# Patient Record
Sex: Male | Born: 1965
Health system: Southern US, Community
[De-identification: ages and names within clinical notes are randomized; demographics above are authoritative.]

## PROBLEM LIST (undated history)

## (undated) DIAGNOSIS — F419 Anxiety disorder, unspecified: Secondary | ICD-10-CM

## (undated) HISTORY — PX: NO PAST SURGERIES: SHX2092

## (undated) HISTORY — DX: Anxiety disorder, unspecified: F41.9

---

## 2001-10-14 ENCOUNTER — Encounter: Payer: Self-pay | Admitting: Specialist

## 2001-10-14 ENCOUNTER — Encounter: Admission: RE | Admit: 2001-10-14 | Discharge: 2001-10-14 | Payer: Self-pay | Admitting: Specialist

## 2001-11-28 ENCOUNTER — Encounter: Payer: Self-pay | Admitting: Internal Medicine

## 2002-01-14 ENCOUNTER — Encounter: Payer: Self-pay | Admitting: Internal Medicine

## 2002-02-25 ENCOUNTER — Encounter: Payer: Self-pay | Admitting: Internal Medicine

## 2002-03-20 ENCOUNTER — Encounter: Payer: Self-pay | Admitting: Internal Medicine

## 2002-03-31 ENCOUNTER — Encounter: Payer: Self-pay | Admitting: Internal Medicine

## 2003-01-18 ENCOUNTER — Emergency Department (HOSPITAL_COMMUNITY): Admission: EM | Admit: 2003-01-18 | Discharge: 2003-01-18 | Payer: Self-pay | Admitting: Emergency Medicine

## 2007-05-22 ENCOUNTER — Ambulatory Visit: Payer: Self-pay | Admitting: Internal Medicine

## 2007-05-22 DIAGNOSIS — E119 Type 2 diabetes mellitus without complications: Secondary | ICD-10-CM | POA: Insufficient documentation

## 2007-05-22 LAB — CONVERTED CEMR LAB
Bilirubin Urine: NEGATIVE
Blood in Urine, dipstick: NEGATIVE
Glucose, Bld: 258 mg/dL
Glucose, Urine, Semiquant: >=1000
Nitrite: NEGATIVE
Protein, U semiquant: NEGATIVE
Specific Gravity, Urine: 1.02
Urobilinogen, UA: NEGATIVE
WBC Urine, dipstick: NEGATIVE
pH: 5

## 2007-05-28 LAB — CONVERTED CEMR LAB
BUN: 16 mg/dL (ref 6–23)
Basophils Absolute: 0 10*3/uL (ref 0.0–0.1)
Basophils Relative: 1 % (ref 0–1)
CO2: 25 meq/L (ref 19–32)
Calcium: 9.6 mg/dL (ref 8.4–10.5)
Chloride: 99 meq/L (ref 96–112)
Creatinine, Ser: 0.87 mg/dL (ref 0.40–1.50)
Eosinophils Absolute: 0 10*3/uL — ABNORMAL LOW (ref 0.2–0.7)
Eosinophils Relative: 0 % (ref 0–5)
Glucose, Bld: 243 mg/dL — ABNORMAL HIGH (ref 70–99)
HCT: 44.5 % (ref 39.0–52.0)
Hemoglobin: 14.8 g/dL (ref 13.0–17.0)
Hgb A1c MFr Bld: 11.9 % — ABNORMAL HIGH (ref 4.6–6.1)
Lymphocytes Relative: 44 % (ref 12–46)
Lymphs Abs: 2.2 10*3/uL (ref 0.7–4.0)
MCHC: 33.3 g/dL (ref 30.0–36.0)
MCV: 82.4 fL (ref 78.0–100.0)
Monocytes Absolute: 0.5 10*3/uL (ref 0.1–1.0)
Monocytes Relative: 10 % (ref 3–12)
Neutro Abs: 2.3 10*3/uL (ref 1.7–7.7)
Neutrophils Relative %: 45 % (ref 43–77)
Platelets: 327 10*3/uL (ref 150–400)
Potassium: 5.5 meq/L — ABNORMAL HIGH (ref 3.5–5.3)
RBC: 5.4 M/uL (ref 4.22–5.81)
RDW: 12.5 % (ref 11.5–15.5)
Sodium: 134 meq/L — ABNORMAL LOW (ref 135–145)
TSH: 1.639 microintl units/mL (ref 0.350–5.50)
WBC: 5 10*3/uL (ref 4.0–10.5)

## 2007-06-12 ENCOUNTER — Ambulatory Visit: Payer: Self-pay | Admitting: Internal Medicine

## 2007-06-17 LAB — CONVERTED CEMR LAB
ALT: 27 units/L (ref 0–53)
AST: 24 units/L (ref 0–37)
Albumin: 4.1 g/dL (ref 3.5–5.2)
Alkaline Phosphatase: 75 units/L (ref 39–117)
BUN: 14 mg/dL (ref 6–23)
Bilirubin, Direct: 0.1 mg/dL (ref 0.0–0.3)
CO2: 29 meq/L (ref 19–32)
Calcium: 9.7 mg/dL (ref 8.4–10.5)
Chloride: 100 meq/L (ref 96–112)
Creatinine, Ser: 1.1 mg/dL (ref 0.4–1.5)
GFR calc Af Amer: 95 mL/min
GFR calc non Af Amer: 78 mL/min
Glucose, Bld: 182 mg/dL — ABNORMAL HIGH (ref 70–99)
Potassium: 4.9 meq/L (ref 3.5–5.1)
Sodium: 136 meq/L (ref 135–145)
Total Bilirubin: 1 mg/dL (ref 0.3–1.2)
Total Protein: 7.8 g/dL (ref 6.0–8.3)

## 2007-08-07 ENCOUNTER — Ambulatory Visit: Payer: Self-pay | Admitting: Internal Medicine

## 2007-08-12 LAB — CONVERTED CEMR LAB
ALT: 29 units/L (ref 0–53)
AST: 28 units/L (ref 0–37)
Albumin: 4.3 g/dL (ref 3.5–5.2)
Alkaline Phosphatase: 70 units/L (ref 39–117)
Bilirubin, Direct: 0.1 mg/dL (ref 0.0–0.3)
Creatinine,U: 74.9 mg/dL
Hgb A1c MFr Bld: 8.4 % — ABNORMAL HIGH (ref 4.6–6.0)
Microalb Creat Ratio: 10.7 mg/g (ref 0.0–30.0)
Microalb, Ur: 0.8 mg/dL (ref 0.0–1.9)
Total Bilirubin: 0.8 mg/dL (ref 0.3–1.2)
Total Protein: 8 g/dL (ref 6.0–8.3)

## 2007-11-26 ENCOUNTER — Encounter: Payer: Self-pay | Admitting: Internal Medicine

## 2007-11-27 ENCOUNTER — Ambulatory Visit: Payer: Self-pay | Admitting: Internal Medicine

## 2007-12-05 ENCOUNTER — Ambulatory Visit: Payer: Self-pay | Admitting: Internal Medicine

## 2007-12-10 LAB — CONVERTED CEMR LAB
ALT: 26 units/L (ref 0–53)
AST: 28 units/L (ref 0–37)
BUN: 19 mg/dL (ref 6–23)
CO2: 27 meq/L (ref 19–32)
Calcium: 9.4 mg/dL (ref 8.4–10.5)
Chloride: 104 meq/L (ref 96–112)
Cholesterol: 165 mg/dL (ref 0–200)
Creatinine, Ser: 0.9 mg/dL (ref 0.4–1.5)
Creatinine,U: 121.8 mg/dL
GFR calc Af Amer: 120 mL/min
GFR calc non Af Amer: 99 mL/min
Glucose, Bld: 110 mg/dL — ABNORMAL HIGH (ref 70–99)
HDL: 55.5 mg/dL (ref >39.0)
Hgb A1c MFr Bld: 6.7 % — ABNORMAL HIGH (ref 4.6–6.0)
LDL Cholesterol: 89 mg/dL (ref 0–99)
Microalb Creat Ratio: 12.3 mg/g (ref 0.0–30.0)
Microalb, Ur: 1.5 mg/dL (ref 0.0–1.9)
Potassium: 4.3 meq/L (ref 3.5–5.1)
Sodium: 139 meq/L (ref 135–145)
Total CHOL/HDL Ratio: 3
Triglycerides: 101 mg/dL (ref 0–149)
VLDL: 20 mg/dL (ref 0–40)

## 2008-02-07 ENCOUNTER — Encounter: Payer: Self-pay | Admitting: Internal Medicine

## 2008-03-30 ENCOUNTER — Ambulatory Visit: Payer: Self-pay | Admitting: Internal Medicine

## 2008-03-30 DIAGNOSIS — M549 Dorsalgia, unspecified: Secondary | ICD-10-CM

## 2008-03-30 DIAGNOSIS — F411 Generalized anxiety disorder: Secondary | ICD-10-CM | POA: Insufficient documentation

## 2008-04-03 ENCOUNTER — Encounter (INDEPENDENT_AMBULATORY_CARE_PROVIDER_SITE_OTHER): Payer: Self-pay | Admitting: *Deleted

## 2008-04-03 LAB — CONVERTED CEMR LAB: Hgb A1c MFr Bld: 6.3 % — ABNORMAL HIGH (ref 4.6–6.0)

## 2009-09-03 ENCOUNTER — Ambulatory Visit: Payer: Self-pay | Admitting: Internal Medicine

## 2009-09-08 ENCOUNTER — Telehealth: Payer: Self-pay | Admitting: Internal Medicine

## 2009-09-08 LAB — CONVERTED CEMR LAB
ALT: 46 units/L (ref 0–53)
AST: 35 units/L (ref 0–37)
BUN: 14 mg/dL (ref 6–23)
CO2: 29 meq/L (ref 19–32)
Calcium: 9.5 mg/dL (ref 8.4–10.5)
Chloride: 100 meq/L (ref 96–112)
Creatinine, Ser: 0.9 mg/dL (ref 0.4–1.5)
Creatinine,U: 201.7 mg/dL
GFR calc non Af Amer: 97.77 mL/min (ref >60)
Glucose, Bld: 163 mg/dL — ABNORMAL HIGH (ref 70–99)
Hgb A1c MFr Bld: 8.3 % — ABNORMAL HIGH (ref 4.6–6.5)
Microalb Creat Ratio: 36.2 mg/g — ABNORMAL HIGH (ref 0.0–30.0)
Microalb, Ur: 7.3 mg/dL — ABNORMAL HIGH (ref 0.0–1.9)
Potassium: 4.4 meq/L (ref 3.5–5.1)
Sodium: 137 meq/L (ref 135–145)

## 2009-09-14 ENCOUNTER — Encounter: Payer: Self-pay | Admitting: Internal Medicine

## 2009-12-13 ENCOUNTER — Ambulatory Visit: Payer: Self-pay | Admitting: Internal Medicine

## 2009-12-22 ENCOUNTER — Encounter (INDEPENDENT_AMBULATORY_CARE_PROVIDER_SITE_OTHER): Payer: Self-pay | Admitting: *Deleted

## 2009-12-22 LAB — CONVERTED CEMR LAB: Hgb A1c MFr Bld: 7.2 % — ABNORMAL HIGH (ref 4.6–6.5)

## 2009-12-24 ENCOUNTER — Telehealth (INDEPENDENT_AMBULATORY_CARE_PROVIDER_SITE_OTHER): Payer: Self-pay | Admitting: *Deleted

## 2010-03-17 ENCOUNTER — Telehealth (INDEPENDENT_AMBULATORY_CARE_PROVIDER_SITE_OTHER): Payer: Self-pay | Admitting: *Deleted

## 2010-04-18 ENCOUNTER — Ambulatory Visit: Payer: Self-pay | Admitting: Internal Medicine

## 2010-04-20 LAB — CONVERTED CEMR LAB
ALT: 27 units/L (ref 0–53)
AST: 28 units/L (ref 0–37)
BUN: 24 mg/dL — ABNORMAL HIGH (ref 6–23)
CO2: 26 meq/L (ref 19–32)
Calcium: 9.5 mg/dL (ref 8.4–10.5)
Chloride: 105 meq/L (ref 96–112)
Creatinine, Ser: 1.1 mg/dL (ref 0.4–1.5)
GFR calc non Af Amer: 81.6 mL/min (ref >60)
Glucose, Bld: 119 mg/dL — ABNORMAL HIGH (ref 70–99)
Hgb A1c MFr Bld: 6.5 % (ref 4.6–6.5)
Potassium: 4.3 meq/L (ref 3.5–5.1)
Sodium: 141 meq/L (ref 135–145)

## 2010-06-28 NOTE — Miscellaneous (Signed)
  Clinical Lists Changes  Medications: Changed medication from METFORMIN HCL 1000 MG  TABS (METFORMIN HCL) 1  tablets 2  times a day to METFORMIN HCL 1000 MG  TABS (METFORMIN HCL) 1 1/2  tablets 2  times a day

## 2010-06-28 NOTE — Progress Notes (Signed)
Summary: Refill Request  Phone Note Refill Request Call back at [phone] Message from:  Pharmacy on March 17, 2010 8:14 AM  Refills Requested: Medication #1:  METFORMIN HCL 1000 MG  TABS 1 1/2  tablets 2  times a day.   Dosage confirmed as above?Dosage Confirmed   Supply Requested: 1 month   Last Refilled: 02/25/2010 [facility] [address]  Next Appointment Scheduled: none Initial call taken by: Harold Barban,  March 17, 2010 8:15 AM    Prescriptions: METFORMIN HCL 1000 MG  TABS (METFORMIN HCL) 1 1/2  tablets 2  times a day  #90 x 1   Entered by:   Army Fossa CMA   Authorized by:   [name]. [name] MD   Signed by:   Army Fossa CMA on 03/17/2010   Method used:   Electronically to        [facility] [address] (retail)       [phone]       [place], [place]  [address]       Ph: [phone]       Fax: [phone]   RxID:   [id number]

## 2010-06-28 NOTE — Assessment & Plan Note (Signed)
Summary: fu on dm/kdc   Vital Signs:  Patient profile:   45 year old male Height:      69.75 inches Weight:      220.6 pounds BMI:     32.00 Pulse rate:   90 / minute BP sitting:   142 / 80  Vitals Entered By: Shary Decamp (September 03, 2009 8:43 AM) CC: rov   History of Present Illness: ROV last OV 11-09, lost job and insurance has taken metformin as recommended (from somebody else) ambulatory CBGs 130s   Current Medications (verified): 1)  Ultra One Touch Test Strips & Lancets .... Check Bs 1x/day Dx 250.00 2)  Freestyle Lancets   Misc (Lancets) .... Checks Bs Qid Dx 250.00 3)  Metformin Hcl 1000 Mg  Tabs (Metformin Hcl) .... Two Times A Day 4)  Lorcet 10/650 10-650 Mg Tabs (Hydrocodone-Acetaminophen) .Marland Kitchen.. 1 By Mouth Every 6 Hours If Pain Severe  Allergies (verified): No Known Drug Allergies  Past History:  Past Medical History: Reviewed history from 03/30/2008 and no changes required. Diabetes mellitus, type II, ds 05-22-07 Anxiety  Past Surgical History: Reviewed history from 05/22/2007 and no changes required. no  Social History: Married  2 kids from [place] diet-- largest meal at night  exercise--  active, walks x3/w  Review of Systems CV:  Denies chest pain or discomfort and swelling of feet. Resp:  Denies cough and shortness of breath. GI:  Denies diarrhea, nausea, and vomiting.  Physical Exam  General:  alert, well-developed, and well-nourished.   Lungs:  normal respiratory effort, no intercostal retractions, no accessory muscle use, and normal breath sounds.   Heart:  normal rate, regular rhythm, and no murmur.   Extremities:  no pretibial edema bilaterally    Impression & Recommendations:  Problem # 1:  DIABETES MELLITUS, TYPE II (ICD-250.00) labs printed material provided regards diet  His updated medication list for this problem includes:    Metformin Hcl 1000 Mg Tabs (Metformin hcl) .Marland Kitchen..Marland Kitchen Two times a day  Labs Reviewed: Creat: 0.9  (12/05/2007)    Reviewed HgBA1c results: 6.3 (03/30/2008)  6.7 (12/05/2007)  Orders: Venipuncture ([address]) TLB-ALT (SGPT) (84460-ALT) TLB-AST (SGOT) (84450-SGOT) TLB-BMP (Basic Metabolic Panel-BMET) (80048-METABOL) TLB-A1C / Hgb A1C (Glycohemoglobin) (83036-A1C) TLB-Microalbumin/Creat Ratio, Urine (82043-MALB)  Complete Medication List: 1)  Diabetic Test Strips  .... Check bs 1x/day dx 250.00 brand of patient's choice 2)  Freestyle Lancets Misc (Lancets) .... Checks bs qid dx 250.00 3)  Metformin Hcl 1000 Mg Tabs (Metformin hcl) .... Two times a day 4)  Lorcet 10/650 10-650 Mg Tabs (Hydrocodone-acetaminophen) .Marland Kitchen.. 1 by mouth every 6 hours if pain severe  Patient Instructions: 1)  Please schedule a follow-up appointment in 3 months (fasting-physical exam) Prescriptions: DIABETIC  TEST STRIPS check BS 1x/day dx 250.00 brand of patient's choice  #3 months x 3   Entered and Authorized by:   Elita Quick E. Ikey Omary MD   Signed by:   [name]. Bryer Cozzolino MD on 09/03/2009   Method used:   Print then Give to Patient   RxID:   [id number] METFORMIN HCL 1000 MG  TABS (METFORMIN HCL) two times a day  #60 x 6   Entered and Authorized by:   [name]. Jahsir Rama MD   Signed by:   [name]. Yulia Ulrich MD on 09/03/2009   Method used:   Print then Give to Patient   RxID:   [id number]

## 2010-06-28 NOTE — Progress Notes (Signed)
Summary: labs-lmom  Phone Note Outgoing Call   Call placed by: Shaun Vargas CMA,  December 24, 2009 8:51 AM Call placed to: Patient Summary of Call: Shaun Vargas increase metformin 1000 mg to 1.5 tablets twice a day  su diabetes esta mejor , aumente metformin a 1.5 (una tableta y media) dos veces al dia  llame si necesita  un refill  Follow-up for Phone Call        left message on machine .......Marland KitchenDoristine Vargas CMA  December 24, 2009 8:51 AM   Additional Follow-up for Phone Call Additional follow up Details #1::        labs d/w patient's wife today, she is here with her mother [name]. [name] MD  December 28, 2009 4:24 PM

## 2010-06-28 NOTE — Letter (Signed)
Summary: Generic Letter  [facility] [place]  [address] [place], [place] [address]   Phone: [phone]  Fax: [phone]         09/14/2009     Donne Hazel [address] [place], [place]  [address]    Dear Mr. LITKE,  su diabtes no esta bien controlada, le recomiendo losiguiente: --continue el metformin pero aumente la dosis a 1.5 tabletas [employer] al dia --empieze una nueva medicina llamada Januvia 100mg  1 al dia (le estoy mandando una receta) --regrese en 2 meses a la consulta     Sincerely,          Willow Ora MD

## 2010-06-28 NOTE — Assessment & Plan Note (Signed)
Summary: FOLLOW UP//PH   Vital Signs:  Patient profile:   45 year old male Weight:      208.13 pounds Pulse rate:   82 / minute Pulse rhythm:   regular BP sitting:   148 / 90  (left arm) Cuff size:   large  Vitals Entered By: Army Fossa CMA (April 18, 2010 4:10 PM) CC: Follow up on DM-not fasting  Is Patient Diabetic? Yes   History of Present Illness: diabetes followup He is on his last A1c, we  increased his  metformin, good compliance  Current Medications (verified): 1)  Diabetic  Test Strips .... Check Bs 1x/day Dx 250.00 Brand of Patient's Choice 2)  Freestyle Lancets   Misc (Lancets) .... Checks Bs Qid Dx 250.00 3)  Metformin Hcl 1000 Mg  Tabs (Metformin Hcl) .Marland Kitchen.. 1 1/2  Tablets 2  Times A Day  Allergies (verified): No Known Drug Allergies  Past History:  Past Medical History: Reviewed history from 03/30/2008 and no changes required. Diabetes mellitus, type II, ds 05-22-07 Anxiety  Past Surgical History: Reviewed history from 05/22/2007 and no changes required. no  Social History: Reviewed history from 12/13/2009 and no changes required. Married  2 kids from [place] diet-- largest meal at night  exercise--  active, walks x3/w Works at [employer]  Review of Systems       he remains physically active Eating slightly more lately? No nausea, vomiting, diarrhea Ambulatory CBGs fasting around 120 No history of symptoms consistent with hypoglycemia or hyperglycemia. He works in an extremely hot environment, sweats a lot but takes lots of fluids as well.  Physical Exam  General:  alert and well-developed.   Lungs:  normal respiratory effort, no intercostal retractions, no accessory muscle use, and normal breath sounds.   Heart:  normal rate, regular rhythm, and no murmur.   Extremities:  no pretibial edema bilaterally   Diabetes Management Exam:    Foot Exam (with socks and/or shoes not present):       Sensory-Pinprick/Light touch:          Left  medial foot (L-4): normal          Left dorsal foot (L-5): normal          Left lateral foot (S-1): normal          Right medial foot (L-4): normal          Right dorsal foot (L-5): normal          Right lateral foot (S-1): normal       Sensory-Monofilament:          Left foot: normal          Right foot: normal       Inspection:          Left foot: normal          Right foot: normal       Nails:          Left foot: normal          Right foot: normal   Impression & Recommendations:  Problem # 1:  DIABETES MELLITUS, TYPE II (ICD-250.00) due for labs discussed need to check  his eyes regularly His updated medication list for this problem includes:    Metformin Hcl 1000 Mg Tabs (Metformin hcl) .Marland Kitchen... 1 1/2  tablets 2  times a day  Orders: Venipuncture ([address]) TLB-BMP (Basic Metabolic Panel-BMET) (80048-METABOL) TLB-ALT (SGPT) (84460-ALT) TLB-AST (SGOT) (84450-SGOT) TLB-A1C / Hgb A1C (Glycohemoglobin) (83036-A1C)  Specimen Handling ([address])  Problem # 2:  due for a physical exam, patient aware  Complete Medication List: 1)  Diabetic Test Strips  .... Check bs 1x/day dx 250.00 brand of patient's choice 2)  Freestyle Lancets Misc (Lancets) .... Checks bs qid dx 250.00 3)  Metformin Hcl 1000 Mg Tabs (Metformin hcl) .Marland Kitchen.. 1 1/2  tablets 2  times a day  Patient Instructions: 1)  please come back fasting in 3 months for a physical exam Prescriptions: METFORMIN HCL 1000 MG  TABS (METFORMIN HCL) 1 1/2  tablets 2  times a day  #90 x 6   Entered and Authorized by:   Elita Quick E. [name] MD   Signed by:   [name]. [name] MD on 04/18/2010   Method used:   Reprint   RxID:   [id number] METFORMIN HCL 1000 MG  TABS (METFORMIN HCL) 1 1/2  tablets 2  times a day  #90 x 1   Entered and Authorized by:   Elita Quick E. [name] MD   Signed by:   [name]. [name] MD on 04/18/2010   Method used:   Print then Give to Patient   RxID:   [id number]    Orders Added: 1)  Venipuncture [[address]] 2)  TLB-BMP (Basic  Metabolic Panel-BMET) [80048-METABOL] 3)  TLB-ALT (SGPT) [84460-ALT] 4)  TLB-AST (SGOT) [84450-SGOT] 5)  TLB-A1C / Hgb A1C (Glycohemoglobin) [83036-A1C] 6)  Specimen Handling [99000] 7)  Est. Patient Level III [01027]   Immunization History:  Influenza Immunization History:    Influenza:  strongly declined (04/18/2010)   Immunization History:  Influenza Immunization History:    Influenza:  strongly declined (04/18/2010)

## 2010-06-28 NOTE — Progress Notes (Signed)
  Phone Note Outgoing Call   Summary of Call: A1c not at goal Plan: Increase metformin 1000 mg to 1.5 b.i.d. Alma Friendly diet patient called, LMOM  to return phone call Kiyonna Tortorelli E. Sofhia Ulibarri MD  September 08, 2009 10:38 AM  [facility] Ambera Fedele E. Dasean Brow MD  September 09, 2009 10:20 AM   Follow-up for Phone Call        call patient home again, no answer Plan: Increase metformin to 1.5 tablets a day start [place] 100mg  daily will send a letter in Spanish along with a new prescription for [place]  follow up two months Elsworth Ledin E. Safiyya Stokes MD  September 14, 2009 8:44 AM     New/Updated Medications: METFORMIN HCL 1000 MG  TABS (METFORMIN HCL) 1.5 tablets 2  times a day JANUVIA 100 MG TABS (SITAGLIPTIN PHOSPHATE) 1 by mouth a day Prescriptions: JANUVIA 100 MG TABS (SITAGLIPTIN PHOSPHATE) 1 by mouth a day  #30 x 3   Entered and Authorized by:   Elita Quick E. Kortland Nichols MD   Signed by:   [name]. Daden Mahany MD on 09/14/2009   Method used:   Print then Give to Patient   RxID:   [id number]

## 2010-06-28 NOTE — Assessment & Plan Note (Signed)
Summary: DIABETES CHECK//[facility]   Vital Signs:  Patient profile:   45 year old male Weight:      206.13 pounds Pulse rate:   85 / minute Pulse rhythm:   regular BP sitting:   120 / 76  (left arm) Cuff size:   large  Vitals Entered By: Army Fossa CMA (December 13, 2009 3:35 PM)    [facility]: Pt here to f/u on DM not fasting.    History of Present Illness: last hemoglobin A1c was elevated, we sent  a letter recommending increasing metformin and started [place]  He did not get the letter (wrong address) and he continued taking just metformin as previously prescribed  Current Medications (verified): 1)  Diabetic  Test Strips .... Check Bs 1x/day Dx 250.00 Brand of Patient's Choice 2)  Freestyle Lancets   Misc (Lancets) .... Checks Bs Qid Dx 250.00 3)  Metformin Hcl 1000 Mg  Tabs (Metformin Hcl) .... 1.5 Tablets 2  Times A Day 4)  Januvia 100 Mg Tabs (Sitagliptin Phosphate) .Marland Kitchen.. 1 By Mouth A Day  Allergies (verified): No Known Drug Allergies  Past History:  Past Medical History: Reviewed history from 03/30/2008 and no changes required. Diabetes mellitus, type II, ds 05-22-07 Anxiety  Past Surgical History: Reviewed history from 05/22/2007 and no changes required. no  Social History: Married  2 kids from [place] diet-- largest meal at night  exercise--  active, walks x3/w Works at [employer]  Review of Systems       feeling well He started back working, he works in a factory, heavy physical job. Very active Complaining of feeling bloated in the morning. Admits to eat late at night, denies nausea, vomiting, diarrhea or heartburn CBGs checked sometimes usually in the 120s  Physical Exam  General:  alert and well-developed.   Lungs:  normal respiratory effort, no intercostal retractions, no accessory muscle use, and normal breath sounds.   Heart:  normal rate, regular rhythm, and no murmur.   Abdomen:  soft, non-tender, no distention, no masses, no guarding, and no rigidity.      Impression & Recommendations:  Problem # 1:  DIABETES MELLITUS, TYPE II (ICD-250.00) poorly controlled per hemoglobin A1c 10 weeks ago Did not increase her metformin or started [place]  as recommended His lifestyle has changed quite a bit, he is back working and very active; weight loss noted Plan  recheck hemoglobin A1c, because he's different lifestyle is possible he will be okay w/  just in metformin  The following medications were removed from the medication list:    Januvia 100 Mg Tabs (Sitagliptin phosphate) .Marland Kitchen... 1 by mouth a day His updated medication list for this problem includes:    Metformin Hcl 1000 Mg Tabs (Metformin hcl) .Marland Kitchen... 1  tablets 2  times a day  Labs Reviewed: Creat: 0.9 (09/03/2009)    Reviewed HgBA1c results: 8.3 (09/03/2009)  6.3 (03/30/2008)  Orders: Venipuncture ([address]) TLB-A1C / Hgb A1C (Glycohemoglobin) (83036-A1C) Specimen Handling (09[address])  Problem # 2:   dyspepsia  see review of systems Reassess and return to the office Recommend not to eat abundantly late at night  Complete Medication List: 1)  Diabetic Test Strips  .... Check bs 1x/day dx 250.00 brand of patient's choice 2)  Freestyle Lancets Misc (Lancets) .... Checks bs qid dx 250.00 3)  Metformin Hcl 1000 Mg Tabs (Metformin hcl) .Marland Kitchen.. 1  tablets 2  times a day  Patient Instructions: 1)  Please schedule a follow-up appointment in 4 months .

## 2010-07-27 ENCOUNTER — Encounter: Payer: Self-pay | Admitting: Internal Medicine

## 2010-07-27 ENCOUNTER — Encounter (INDEPENDENT_AMBULATORY_CARE_PROVIDER_SITE_OTHER): Payer: Managed Care, Other (non HMO) | Admitting: Internal Medicine

## 2010-07-27 DIAGNOSIS — Z0000 Encounter for general adult medical examination without abnormal findings: Secondary | ICD-10-CM

## 2010-07-28 ENCOUNTER — Encounter (INDEPENDENT_AMBULATORY_CARE_PROVIDER_SITE_OTHER): Payer: Self-pay | Admitting: *Deleted

## 2010-08-03 ENCOUNTER — Encounter (INDEPENDENT_AMBULATORY_CARE_PROVIDER_SITE_OTHER): Payer: Self-pay | Admitting: *Deleted

## 2010-08-03 ENCOUNTER — Other Ambulatory Visit (INDEPENDENT_AMBULATORY_CARE_PROVIDER_SITE_OTHER): Payer: Managed Care, Other (non HMO)

## 2010-08-03 ENCOUNTER — Other Ambulatory Visit: Payer: Self-pay | Admitting: Internal Medicine

## 2010-08-03 DIAGNOSIS — Z0000 Encounter for general adult medical examination without abnormal findings: Secondary | ICD-10-CM

## 2010-08-03 DIAGNOSIS — E119 Type 2 diabetes mellitus without complications: Secondary | ICD-10-CM

## 2010-08-03 LAB — CBC WITH DIFFERENTIAL/PLATELET
Basophils Relative: 0.7 % (ref 0.0–3.0)
Eosinophils Relative: 0.8 % (ref 0.0–5.0)
HCT: 43.6 % (ref 39.0–52.0)
Hemoglobin: 14.2 g/dL (ref 13.0–17.0)
Lymphs Abs: 2.2 10*3/uL (ref 0.7–4.0)
MCV: 85.8 fl (ref 78.0–100.0)
Monocytes Absolute: 0.6 10*3/uL (ref 0.1–1.0)
Monocytes Relative: 12.2 % — ABNORMAL HIGH (ref 3.0–12.0)
Neutro Abs: 2.2 10*3/uL (ref 1.4–7.7)
Platelets: 307 10*3/uL (ref 150.0–400.0)
WBC: 5.1 10*3/uL (ref 4.5–10.5)

## 2010-08-03 LAB — LIPID PANEL
Cholesterol: 187 mg/dL (ref 0–200)
LDL Cholesterol: 91 mg/dL (ref 0–99)
Total CHOL/HDL Ratio: 3
Triglycerides: 175 mg/dL — ABNORMAL HIGH (ref 0.0–149.0)

## 2010-08-03 LAB — BASIC METABOLIC PANEL
CO2: 27 mEq/L (ref 19–32)
Chloride: 102 mEq/L (ref 96–112)
Creatinine, Ser: 0.7 mg/dL (ref 0.4–1.5)
Potassium: 4.5 mEq/L (ref 3.5–5.1)

## 2010-08-03 LAB — PSA: PSA: 1.21 ng/mL (ref 0.10–4.00)

## 2010-08-04 NOTE — Letter (Signed)
Summary: [facility]: Immunoassay Fecal Occult Blood (iFOB) Order Form  [facility] [place]  [address] [place], [place] [address]   Phone: [phone]  Fax: [phone]       Lab: Immunoassay Fecal Occult Blood (iFOB) Order Form   July 28, 2010 MRN: [id number]   Shaun Vargas [birth year]   Physicican Name:__jose paz, md _______________________  Diagnosis Code:_____v76.51_____________________      Army Fossa CMA

## 2010-08-04 NOTE — Assessment & Plan Note (Signed)
Summary: physical   Vital Signs:  Patient profile:   45 year old male Height:      69.7 inches Weight:      220 pounds BMI:     31.95 Pulse rate:   88 / minute Pulse rhythm:   regular BP sitting:   128 / 86  (left arm) Cuff size:   large  Vitals Entered By: Army Fossa CMA (July 27, 2010 2:57 PM) CC: CPX, not fasting  Comments [facility] [address]    History of Present Illness: CPX no major concerns   Preventive Screening-Counseling & Management  Caffeine-Diet-Exercise     Does Patient Exercise: yes  Current Medications (verified): 1)  Diabetic  Test Strips .... Check Bs 1x/day Dx 250.00 Brand of Patient's Choice 2)  Freestyle Lancets   Misc (Lancets) .... Checks Bs Qid Dx 250.00 3)  Metformin Hcl 1000 Mg  Tabs (Metformin Hcl) .Marland Kitchen.. 1 1/2  Tablets 2  Times A Day  Allergies (verified): No Known Drug Allergies  Past History:  Past Medical History: Reviewed history from 03/30/2008 and no changes required. Diabetes mellitus, type II, ds 05-22-07 Anxiety  Past Surgical History: Reviewed history from 05/22/2007 and no changes required. no  Family History: Reviewed history from 05/22/2007 and no changes required. CAD - NO HTN - NO STROKE - NO PROSTATE CA- NO COLON CA - NO DM - GM  Social History: Married  2 kids from [place] tobacco-- no ETOH--rarely  diet-- usually ok, problems w/  portion control  exercise--  active @ work,  walks x3/w weather permits  Works at a factoryDoes Patient Exercise:  yes  Review of Systems General:  Denies fatigue; some wt gain lately . CV:  Denies chest pain or discomfort and swelling of feet. Resp:  Denies cough and shortness of breath. GI:  Denies bloody stools, diarrhea, and nausea. GU:  Denies dysuria, urinary frequency, and urinary hesitancy. Psych:  Denies anxiety and depression. Endo:  ambulatory CBGs  ~ 130s , good medication compliance .  Physical Exam  General:  alert, well-developed, and slt  overweight-appearing.   Neck:  no masses and no thyromegaly.   Lungs:  normal respiratory effort, no intercostal retractions, no accessory muscle use, and normal breath sounds.   Heart:  normal rate, regular rhythm, and no murmur.   Abdomen:  soft, non-tender, no distention, no masses, no guarding, and no rigidity.   Rectal:  declined Pulses:  no pretibial edema bilaterally  Extremities:  no pretibial edema bilaterally   Diabetes Management Exam:    Foot Exam (with socks and/or shoes not present):       Sensory-Pinprick/Light touch:          Left medial foot (L-4): normal          Left dorsal foot (L-5): normal          Left lateral foot (S-1): normal          Right medial foot (L-4): normal          Right dorsal foot (L-5): normal          Right lateral foot (S-1): normal       Sensory-Monofilament:          Left foot: normal          Right foot: normal       Inspection:          Left foot: normal          Right foot: normal  Nails:          Left foot: normal          Right foot: normal   Impression & Recommendations:  Problem # 1:  ROUTINE GENERAL MEDICAL EXAM@HEALTH  CARE FACL (ICD-V70.0) last Td?   never had a colonoscopy He is a 45 years old spanic male,  we'll start screening with a PSA and a iFOB  labs, see instructions   Problem # 2:  DIABETES MELLITUS, TYPE II (ICD-250.00)  well-controlled per last hemoglobin A1c. Good medication compliance. He has never talked to a nutritionist but admits that it would be very difficult due to cost. We discussed diet today.  encouraged to see ophthalmology yearly   feet care discussed as well  His updated medication list for this problem includes:    Metformin Hcl 1000 Mg Tabs (Metformin hcl) .Marland Kitchen... 1 1/2  tablets 2  times a day  Labs Reviewed: Creat: 1.1 (04/18/2010)    Reviewed HgBA1c results: 6.5 (04/18/2010)  7.2 (12/13/2009)  Complete Medication List: 1)  Diabetic Test Strips  .... Check bs 1x/day dx 250.00  brand of patient's choice 2)  Freestyle Lancets Misc (Lancets) .... Checks bs qid dx 250.00 3)  Metformin Hcl 1000 Mg Tabs (Metformin hcl) .Marland Kitchen.. 1 1/2  tablets 2  times a day  Patient Instructions: 1)  saque una cita para su laboratorio, tiene que regresar en ayunas 2)  tambien saque una cita para regresar en 4 meses 3)   come back fasting 4)   FLP, PSA, BMP, CBC, TSH---dx v70 5)  Hemoglobin A1c , microalb---dx DM 6)  Please schedule a follow-up appointment in 4 months .    Orders Added: 1)  Est. Patient age 15-64 [8]     Risk Factors:  Alcohol use:  no Exercise:  yes

## 2010-08-09 ENCOUNTER — Telehealth: Payer: Self-pay | Admitting: Internal Medicine

## 2010-08-15 ENCOUNTER — Telehealth: Payer: Self-pay | Admitting: Internal Medicine

## 2010-08-16 NOTE — Progress Notes (Signed)
  Phone Note Outgoing Call   Summary of Call: LMOM:  good results but needs better diabetes control, we'll add Januvia,  will send a letter and a enclosed  prescription. Aydyn Testerman E. Aidian Salomon MD  August 09, 2010 1:13 PM     New/Updated Medications: JANUVIA 100 MG TABS (SITAGLIPTIN PHOSPHATE) 1 by mouth once daily. Prescriptions: JANUVIA 100 MG TABS (SITAGLIPTIN PHOSPHATE) 1 by mouth once daily.  #30 x 6   Entered by:   Army Fossa CMA   Authorized by:   [name]. Marysue Fait MD   Signed by:   Army Fossa CMA on 08/09/2010   Method used:   Print then Mail to Patient   RxID:   [id number]

## 2010-08-23 ENCOUNTER — Other Ambulatory Visit: Payer: Managed Care, Other (non HMO)

## 2010-08-23 ENCOUNTER — Other Ambulatory Visit (INDEPENDENT_AMBULATORY_CARE_PROVIDER_SITE_OTHER): Payer: Managed Care, Other (non HMO) | Admitting: Internal Medicine

## 2010-08-23 DIAGNOSIS — Z0000 Encounter for general adult medical examination without abnormal findings: Secondary | ICD-10-CM

## 2010-08-24 ENCOUNTER — Telehealth: Payer: Self-pay | Admitting: *Deleted

## 2010-08-24 NOTE — Telephone Encounter (Signed)
Message copied by Army Fossa on Wed Aug 24, 2010  1:27 PM ------      Message from: Willow Ora      Created: Tue Aug 23, 2010  9:18 PM       Notify pt: test negative

## 2010-08-24 NOTE — Telephone Encounter (Signed)
Message left for patient to return my call.  

## 2010-08-25 ENCOUNTER — Telehealth: Payer: Self-pay | Admitting: Internal Medicine

## 2010-08-25 NOTE — Telephone Encounter (Signed)
Left message for pt to call back  °

## 2010-08-25 NOTE — Telephone Encounter (Signed)
We received a call from patient's friend stating that patient got a message earlier but he didn't understand it because he doesn't speak English---I said I would send note to nurse explaining situation

## 2010-08-25 NOTE — Progress Notes (Signed)
Summary: [facility]   Phone Note Call from Patient Call back at [phone]   Caller: Spouse Summary of Call: Pts spouse called and states that they cannot afford the Januvia. Please advise. Army Fossa CMA  August 15, 2010 1:21 PM   Follow-up for Phone Call        d/c januvia amaryl 1mg  1 by mouth once daily #30, 3 RF watch for low sugars OV 3 months  Raul Torrance E. Tressia Labrum MD  August 15, 2010 4:58 PM   Additional Follow-up for Phone Call Additional follow up Details #1::        left message for pt to cal back. Army Fossa CMA  August 15, 2010 5:04 PM     Additional Follow-up for Phone Call Additional follow up Details #2::    Pt aware, will send in rx. Army Fossa CMA  August 16, 2010 11:07 AM   New/Updated Medications: AMARYL 1 MG TABS (GLIMEPIRIDE) 1 by mouth daily. Prescriptions: AMARYL 1 MG TABS (GLIMEPIRIDE) 1 by mouth daily.  #30 x 3   Entered by:   Army Fossa CMA   Authorized by:   [name]. Ardean Simonich MD   Signed by:   Army Fossa CMA on 08/16/2010   Method used:   Electronically to        [facility] (retail)       [phone]       [place], [place]  [address]       Ph: [phone]       Fax: [phone]   RxID:   [id number]

## 2010-08-26 NOTE — Telephone Encounter (Signed)
Left message on machine, iFOB negative

## 2010-08-26 NOTE — Telephone Encounter (Signed)
Pt called doesn't understand English will have Dr. Drue Novel speak w/ pt.

## 2010-11-24 ENCOUNTER — Encounter: Payer: Self-pay | Admitting: Internal Medicine

## 2010-11-25 ENCOUNTER — Ambulatory Visit (INDEPENDENT_AMBULATORY_CARE_PROVIDER_SITE_OTHER): Payer: Managed Care, Other (non HMO) | Admitting: Internal Medicine

## 2010-11-25 ENCOUNTER — Encounter: Payer: Self-pay | Admitting: Internal Medicine

## 2010-11-25 VITALS — BP 142/82 | HR 92 | Wt 209.2 lb

## 2010-11-25 DIAGNOSIS — E119 Type 2 diabetes mellitus without complications: Secondary | ICD-10-CM

## 2010-11-25 LAB — HEMOGLOBIN A1C: Hgb A1c MFr Bld: 6.7 % — ABNORMAL HIGH (ref <5.7)

## 2010-11-25 LAB — ALT: ALT: 31 U/L (ref 0–53)

## 2010-11-25 MED ORDER — METFORMIN HCL 1000 MG PO TABS: 1500.0000 mg | ORAL_TABLET | Freq: Two times a day (BID) | ORAL | Status: DC

## 2010-11-25 MED ORDER — GLIMEPIRIDE 1 MG PO TABS: 1.0000 mg | ORAL_TABLET | Freq: Every day | ORAL | Status: DC

## 2010-11-25 NOTE — Progress Notes (Signed)
  Subjective:    Patient ID: Shaun Vargas, male    DOB: December 16, 1965, 45 y.o.   MRN: [id number]  HPI ROV, here with his wife. Based on the last hemoglobin A1c he was prescribed Januvia but couldn't afford it , we added instead  Amaryl.  Past Medical History  Diagnosis Date  . Diabetes mellitus 05/22/07  . Anxiety     No past surgical history on file.   Social History: Married  2 kids from [place] tobacco-- no ETOH--rarely  diet-- usually ok, problems w/  portion control  exercise--  active @ work,  walks x3/week  weather permits  Works at [employer]   Review of Systems Good medication compliance with all medicines, the largest meal of the face upper, he has been taking on [place] a known. Very rarely checks his blood sugars, the last time was 105. He is very active at work, diet is okay. No symptoms consistent with low blood sugars.    Objective:   Physical Exam  Constitutional: He is oriented to person, place, and time. He appears well-developed and well-nourished.  HENT:  Head: Normocephalic and atraumatic.  Cardiovascular: Normal rate, regular rhythm and normal heart sounds.   Pulmonary/Chest: Effort normal. No respiratory distress. He has no wheezes. He has no rales.  Musculoskeletal: He exhibits no edema.  Neurological: He is alert and oriented to person, place, and time.  Psychiatric: He has a normal mood and affect. His behavior is normal. Judgment and thought content normal.          Assessment & Plan:

## 2010-11-25 NOTE — Assessment & Plan Note (Signed)
Seems to be doing okay with amaryl 1 mg although we don't have ambulatory blood sugars. Labs. Adjust medicines if necessary

## 2010-11-28 ENCOUNTER — Encounter: Payer: Self-pay | Admitting: Internal Medicine

## 2011-03-22 ENCOUNTER — Other Ambulatory Visit: Payer: Self-pay | Admitting: Internal Medicine

## 2011-04-10 ENCOUNTER — Ambulatory Visit: Payer: Managed Care, Other (non HMO) | Admitting: Internal Medicine

## 2011-04-10 DIAGNOSIS — Z0289 Encounter for other administrative examinations: Secondary | ICD-10-CM

## 2011-05-02 ENCOUNTER — Ambulatory Visit (INDEPENDENT_AMBULATORY_CARE_PROVIDER_SITE_OTHER): Payer: Managed Care, Other (non HMO) | Admitting: Internal Medicine

## 2011-05-02 ENCOUNTER — Encounter: Payer: Self-pay | Admitting: Internal Medicine

## 2011-05-02 VITALS — BP 118/78 | HR 97 | Temp 98.8°F | Wt 213.6 lb

## 2011-05-02 DIAGNOSIS — G4700 Insomnia, unspecified: Secondary | ICD-10-CM | POA: Insufficient documentation

## 2011-05-02 DIAGNOSIS — E119 Type 2 diabetes mellitus without complications: Secondary | ICD-10-CM

## 2011-05-02 MED ORDER — METFORMIN HCL 1000 MG PO TABS: 1500.0000 mg | ORAL_TABLET | Freq: Two times a day (BID) | ORAL | Status: DC

## 2011-05-02 MED ORDER — GLIMEPIRIDE 1 MG PO TABS: 1.0000 mg | ORAL_TABLET | Freq: Every day | ORAL | Status: DC

## 2011-05-02 MED ORDER — ZOLPIDEM TARTRATE 10 MG PO TABS: 10.0000 mg | ORAL_TABLET | Freq: Every evening | ORAL | Status: AC | PRN

## 2011-05-02 NOTE — Progress Notes (Signed)
  Subjective:    Patient ID: Shaun Vargas, male    DOB: 02-19-1966, 45 y.o.   MRN: [id number]  HPI Routine office visit goog compliance with diabetes medicines, no apparent side effects. Reports a long history of difficulty sleeping both falling asleep and waking up frequently or too early. Sx going on for years. The patient would like to have something to take in case he needs; denies excessive daytime somnolence, snores sometimes   Past Medical History  Diagnosis Date  . Diabetes mellitus 05/22/07  . Anxiety    No past surgical history on file.   Review of Systems Denying any changes in his vision Denies any feet burning Ambulatory blood sugars are less than 130 to 140.     Objective:   Physical Exam  Constitutional: He is oriented to person, place, and time. He appears well-developed.  Musculoskeletal:       DIABETIC FEET EXAM: No lower extremity edema Normal pedal pulses bilaterally Skin and nails are normal without calluses Pinprick examination of the feet normal.   Neurological: He is alert and oriented to person, place, and time.  Psychiatric: He has a normal mood and affect. His behavior is normal. Judgment and thought content normal.       Assessment & Plan:

## 2011-05-02 NOTE — Assessment & Plan Note (Addendum)
Long h/o insomnia, has never tried any meds but is ready to have something to take as needed  Rx ambien 10 mg, recommended to take half to one as needed

## 2011-05-02 NOTE — Assessment & Plan Note (Signed)
Seems to be doing ok Feet exam neg today rec routine eye check Labs Declined flu shot , explained benefits, will call if changes his mind

## 2011-05-03 LAB — BASIC METABOLIC PANEL
CO2: 29 mEq/L (ref 19–32)
Calcium: 9.4 mg/dL (ref 8.4–10.5)
GFR: 91.15 mL/min (ref >60.00)
Sodium: 140 mEq/L (ref 135–145)

## 2011-08-29 ENCOUNTER — Ambulatory Visit: Payer: Managed Care, Other (non HMO) | Admitting: Internal Medicine

## 2011-08-29 ENCOUNTER — Ambulatory Visit (INDEPENDENT_AMBULATORY_CARE_PROVIDER_SITE_OTHER): Payer: Managed Care, Other (non HMO) | Admitting: Internal Medicine

## 2011-08-29 ENCOUNTER — Encounter: Payer: Self-pay | Admitting: Internal Medicine

## 2011-08-29 VITALS — BP 142/84 | HR 93 | Temp 98.2°F | Wt 216.0 lb

## 2011-08-29 DIAGNOSIS — G4700 Insomnia, unspecified: Secondary | ICD-10-CM

## 2011-08-29 DIAGNOSIS — E119 Type 2 diabetes mellitus without complications: Secondary | ICD-10-CM

## 2011-08-29 DIAGNOSIS — M549 Dorsalgia, unspecified: Secondary | ICD-10-CM

## 2011-08-29 NOTE — Progress Notes (Signed)
  Subjective:    Patient ID: Shaun Vargas, male    DOB: 1965-09-21, 46 y.o.   MRN: [id number]  HPI Routine office visit Diabetes, good medication compliance, amb blood sugars around 120 130.   Past Medical History  Diagnosis Date  . Diabetes mellitus 05/22/07  . Anxiety     Review of Systems BPs are checked from time to time, they are usually okay. He tried Ambien, one tablet, he was too sleepy next morning. Denies any symptoms consistent with brochures. No nausea, vomiting, diarrhea No shortness of breath chest pain.     Objective:   Physical Exam General -- alert, well-developed, and well-nourished. NAD  lungs -- normal respiratory effort, no intercostal retractions, no accessory muscle use, and normal breath sounds.   Heart-- normal rate, regular rhythm, no murmur, and no gallop.   Extremities-- no pretibial edema bilaterally      Assessment & Plan:

## 2011-08-29 NOTE — Assessment & Plan Note (Signed)
Occasional low back pain without radiation, recommend ibuprofen when necessary. GI side effects discussed

## 2011-08-29 NOTE — Patient Instructions (Signed)
regrese en 3 meses, en ayunas para un examen fisico -------------------------------------------------- Ibuprofen 200 mg: 2 o 3 pastillas cada 8 horas si le duele la espalda, deje de tomarlas si le [employer] (ardor, nausea) ---------------------------------------------- trate de tomar solamante media pastilla de dormir

## 2011-08-29 NOTE — Assessment & Plan Note (Signed)
Oversedated w/  ambien 10 mg, rec to try 1/2 tab

## 2011-08-29 NOTE — Assessment & Plan Note (Addendum)
Good med compliance , labs  BP slt elevated today but usually ok, no change

## 2011-09-04 ENCOUNTER — Encounter: Payer: Self-pay | Admitting: Internal Medicine

## 2012-02-07 ENCOUNTER — Other Ambulatory Visit: Payer: Self-pay | Admitting: Internal Medicine

## 2012-02-08 NOTE — Telephone Encounter (Signed)
Zolpidem last filled 04/29/11. Pt has f/u on 02/28/12.  Please advise re: refill.

## 2012-02-13 NOTE — Telephone Encounter (Signed)
Ok #30, no RF 

## 2012-02-14 NOTE — Telephone Encounter (Signed)
Refill done.  

## 2012-02-15 ENCOUNTER — Telehealth: Payer: Self-pay | Admitting: Internal Medicine

## 2012-02-15 NOTE — Telephone Encounter (Signed)
Maria left vm on triage line for this pt stating his rx is not at the pharmacy. She did not leave any additional information.

## 2012-02-15 NOTE — Telephone Encounter (Signed)
Left message on answering machine for patient to call back.  

## 2012-02-16 MED ORDER — METFORMIN HCL 1000 MG PO TABS: 1500.0000 mg | ORAL_TABLET | Freq: Two times a day (BID) | ORAL | Status: DC

## 2012-02-16 NOTE — Telephone Encounter (Signed)
Pt needs Metformin called in to [facility] on [address]. Pt has upcoming appt 02/28/12.

## 2012-02-16 NOTE — Telephone Encounter (Signed)
LMOVM for pt to return call 

## 2012-02-16 NOTE — Addendum Note (Signed)
Addended by: Edwena Felty T on: 02/16/2012 10:27 AM   Modules accepted: Orders

## 2012-02-16 NOTE — Telephone Encounter (Signed)
Refill done.  

## 2012-02-28 ENCOUNTER — Ambulatory Visit (INDEPENDENT_AMBULATORY_CARE_PROVIDER_SITE_OTHER): Payer: PRIVATE HEALTH INSURANCE | Admitting: Internal Medicine

## 2012-02-28 ENCOUNTER — Encounter: Payer: Self-pay | Admitting: Internal Medicine

## 2012-02-28 VITALS — BP 140/86 | HR 64 | Temp 98.1°F | Ht 69.5 in | Wt 220.0 lb

## 2012-02-28 DIAGNOSIS — E119 Type 2 diabetes mellitus without complications: Secondary | ICD-10-CM

## 2012-02-28 DIAGNOSIS — Z0000 Encounter for general adult medical examination without abnormal findings: Secondary | ICD-10-CM

## 2012-02-28 DIAGNOSIS — Z23 Encounter for immunization: Secondary | ICD-10-CM

## 2012-02-28 LAB — CBC WITH DIFFERENTIAL/PLATELET
Eosinophils Relative: 0.5 % (ref 0.0–5.0)
HCT: 43.5 % (ref 39.0–52.0)
Hemoglobin: 13.6 g/dL (ref 13.0–17.0)
Lymphocytes Relative: 39.7 % (ref 12.0–46.0)
Lymphs Abs: 2.2 10*3/uL (ref 0.7–4.0)
Monocytes Relative: 12.4 % — ABNORMAL HIGH (ref 3.0–12.0)
Platelets: 307 10*3/uL (ref 150.0–400.0)
WBC: 5.6 10*3/uL (ref 4.5–10.5)

## 2012-02-28 LAB — COMPREHENSIVE METABOLIC PANEL
Albumin: 4.3 g/dL (ref 3.5–5.2)
Alkaline Phosphatase: 60 U/L (ref 39–117)
BUN: 12 mg/dL (ref 6–23)
GFR: 123.08 mL/min (ref >60.00)
Glucose, Bld: 102 mg/dL — ABNORMAL HIGH (ref 70–99)
Total Bilirubin: 0.9 mg/dL (ref 0.3–1.2)

## 2012-02-28 LAB — HEMOGLOBIN A1C: Hgb A1c MFr Bld: 7.1 % — ABNORMAL HIGH (ref 4.6–6.5)

## 2012-02-28 LAB — LIPID PANEL
Cholesterol: 202 mg/dL — ABNORMAL HIGH (ref 0–200)
HDL: 67.1 mg/dL (ref >39.00)
Triglycerides: 110 mg/dL (ref 0.0–149.0)
VLDL: 22 mg/dL (ref 0.0–40.0)

## 2012-02-28 MED ORDER — DICLOFENAC SODIUM 1 % TD GEL: 2.0000 g | Freq: Three times a day (TID) | TRANSDERMAL | Status: DC

## 2012-02-28 NOTE — Progress Notes (Signed)
  Subjective:    Patient ID: Shaun Vargas, male    DOB: [birth year], 46 y.o.   MRN: [id number]  HPI CPX  Past Medical History: Diabetes mellitus, type II, ds 05-22-07 Anxiety  Past Surgical History: no  Family History: CAD - NO HTN - NO STROKE - NO PROSTATE CA- NO COLON CA - NO DM - GM  Social History: Married , 2 kids, from [place] tobacco-- no ETOH--rarely  diet-- usually ok, room for improvement  exercise--  active @ work   Works at [employer]   Review of Systems  Respiratory: Negative for cough and shortness of breath.   Cardiovascular: Negative for chest pain and leg swelling.  Gastrointestinal: Negative for abdominal pain and blood in stool.  Genitourinary: Negative for dysuria, hematuria and difficulty urinating.  Psychiatric/Behavioral:       No depression or anxiety        Objective:   Physical Exam General -- alert, well-developed, BMI 32.   Neck --no thyromegaly , normal carotid pulse Lungs -- normal respiratory effort, no intercostal retractions, no accessory muscle use, and normal breath sounds.   Heart-- normal rate, regular rhythm, no murmur, and no gallop.   Abdomen--soft, non-tender, no distention, no masses, no HSM, no guarding, and no rigidity.   DIABETIC FEET EXAM: No lower extremity edema Normal pedal pulses bilaterally Skin and nails are normal without calluses Pinprick examination of the feet normal. Neurologic-- alert & oriented X3 and strength normal in all extremities. Psych-- Cognition and judgment appear intact. Alert and cooperative with normal attention span and concentration.  not anxious appearing and not depressed appearing.       Assessment & Plan:

## 2012-02-28 NOTE — Assessment & Plan Note (Signed)
CBGs in AM ~ 130, good medication compliance Labs

## 2012-02-28 NOTE — Assessment & Plan Note (Addendum)
last Td 2005? Gave one today Flu shot --- will get at work never had a colonoscopy He is a 46years old spanic male, in 2012 had a normal PSA and a (-) iFOB; recheck those test next year Diet-exercise  Labs  EKG with no acute changes Also requests voltaren gel to be use when necessary for knee pain, prescription provided

## 2012-03-05 ENCOUNTER — Telehealth: Payer: Self-pay | Admitting: Internal Medicine

## 2012-03-05 NOTE — Telephone Encounter (Signed)
Labs: Cholesterol used to be better but still satisfactory. Needs better diabetes control, Increase Amaryl 1 mg from one tablet a day to 2 tablets daily. LMOM

## 2012-03-12 MED ORDER — GLIMEPIRIDE 2 MG PO TABS: 2.0000 mg | ORAL_TABLET | Freq: Every day | ORAL | Status: DC

## 2012-03-12 NOTE — Telephone Encounter (Signed)
Spoke with patient, I sent a new prescription for Amaryl 2 mg to his pharmacy. Lillia Abed, please mail a  copy of his blood work

## 2012-03-13 NOTE — Telephone Encounter (Signed)
Mailed results  

## 2012-06-10 ENCOUNTER — Other Ambulatory Visit: Payer: Self-pay | Admitting: Internal Medicine

## 2012-06-10 NOTE — Telephone Encounter (Signed)
Refill done.  

## 2012-08-30 ENCOUNTER — Encounter: Payer: Self-pay | Admitting: Lab

## 2012-09-02 ENCOUNTER — Encounter: Payer: Self-pay | Admitting: Internal Medicine

## 2012-09-02 ENCOUNTER — Ambulatory Visit (INDEPENDENT_AMBULATORY_CARE_PROVIDER_SITE_OTHER): Payer: PRIVATE HEALTH INSURANCE | Admitting: Internal Medicine

## 2012-09-02 VITALS — BP 134/84 | HR 88 | Temp 97.9°F | Wt 224.0 lb

## 2012-09-02 DIAGNOSIS — E119 Type 2 diabetes mellitus without complications: Secondary | ICD-10-CM

## 2012-09-02 NOTE — Assessment & Plan Note (Addendum)
Good compliance of medication, feet exam normal. Recommend to see the eye Dr. yearly. Labs including microalbumin. Consider an  ARB. Diet exercise discussed. Last cholesterol panel discussed  Upper extremities paresthesias of unclear origin , rec observation

## 2012-09-02 NOTE — Patient Instructions (Addendum)
Haga ejercicio 3 horas a la semana vea al doctor de los ojos para estar seguro que no tiene diabetes en los ojos regrese de aca a 3 meses

## 2012-09-02 NOTE — Progress Notes (Signed)
  Subjective:    Patient ID: Shaun Vargas, male    DOB: 11-26-1965, 47 y.o.   MRN: [id number]  HPI Routine office visit Diabetes, good medication compliance, takes amaryl mostly at night with his dinner. Complains of R>>L episodic finger numbness, mostly #2, 3 and 4 fingers. Denies neck pain, no lower extremity paresthesias. Has not seen the eye doctor lately, denies any blurred vision or visual disturbances. Sometimes check his blood sugars, around 150, 140. Denies symptoms consistent with low blood sugars. Exercise needs improvement, and in the winter is quite inactive. Diet needs improvement as well.  Past Medical History  Diagnosis Date  . Diabetes mellitus 05/22/07  . Anxiety    Past Surgical History  Procedure Laterality Date  . No past surgeries     History   Social History  . Marital Status: Married    Spouse Name: N/A    Number of Children: 2  . Years of Education: N/A   Occupational History  . 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 Smoking status: Never Smoker   . Smokeless tobacco: Not on file  . Alcohol Use: Yes     Comment: rarely  . Drug Use: Not on file  . Sexually Active: Not on file   Other Topics Concern  . Not on file   Social History Narrative   From [place]   Diet- needs improvement.   Exercise- active at work, walks x3/w weather permits              Review of Systems See history of present illness    Objective:   Physical Exam BP 134/84  Pulse 88  Temp(Src) 97.9 F (36.6 C) (Oral)  Wt 224 lb (101.606 kg)  BMI 32.62 kg/m2  SpO2 97%  General -- alert, well-developed, no apparent distress Lungs -- normal respiratory effort, no intercostal retractions, no accessory muscle use, and normal breath sounds.   Heart-- normal rate, regular rhythm, no murmur, and no gallop.   DIABETIC FEET EXAM: No lower extremity edema Normal pedal pulses bilaterally Skin and nails are normal without calluses Pinprick examination of the  feet normal. Neurologic-- alert & oriented X3 ; DTRs and  strength normal in all extremities. Psych-- Cognition and judgment appear intact. Alert and cooperative with normal attention span and concentration.  not anxious appearing and not depressed appearing.       Assessment & Plan:

## 2012-09-03 LAB — MICROALBUMIN / CREATININE URINE RATIO: Creatinine,U: 131.8 mg/dL

## 2012-09-03 LAB — BASIC METABOLIC PANEL
CO2: 27 mEq/L (ref 19–32)
Glucose, Bld: 165 mg/dL — ABNORMAL HIGH (ref 70–99)
Potassium: 4 mEq/L (ref 3.5–5.1)
Sodium: 136 mEq/L (ref 135–145)

## 2012-09-03 LAB — ALT: ALT: 30 U/L (ref 0–53)

## 2012-09-10 MED ORDER — SITAGLIPTIN PHOS-METFORMIN HCL 50-1000 MG PO TABS: 1.0000 | ORAL_TABLET | Freq: Two times a day (BID) | ORAL | Status: DC

## 2012-09-10 NOTE — Addendum Note (Signed)
Addended by: Edwena Felty T on: 09/10/2012 03:34 PM   Modules accepted: Orders

## 2012-09-26 ENCOUNTER — Encounter: Payer: Self-pay | Admitting: Internal Medicine

## 2012-12-06 ENCOUNTER — Ambulatory Visit (INDEPENDENT_AMBULATORY_CARE_PROVIDER_SITE_OTHER): Payer: PRIVATE HEALTH INSURANCE | Admitting: Internal Medicine

## 2012-12-06 ENCOUNTER — Encounter: Payer: Self-pay | Admitting: Internal Medicine

## 2012-12-06 VITALS — BP 140/78 | HR 95 | Temp 98.1°F | Wt 218.6 lb

## 2012-12-06 DIAGNOSIS — G4700 Insomnia, unspecified: Secondary | ICD-10-CM

## 2012-12-06 DIAGNOSIS — E119 Type 2 diabetes mellitus without complications: Secondary | ICD-10-CM

## 2012-12-06 NOTE — Assessment & Plan Note (Addendum)
Added Januvia based on the last labs, good compliance and tolerance. We he had a conversation about about diet, declined to see a nutritionist. Encouraged to be more active, he is somehow reluctant to him He drinks on the weekends only but sometimes heavy, definitely recommend moderation. Has not seen the eye doctor yet Plan-- labs and cont same meds

## 2012-12-06 NOTE — Progress Notes (Signed)
  Subjective:    Patient ID: Shaun Vargas, male    DOB: 1966-04-03, 47 y.o.   MRN: [id number]  HPI Diabetes checkup Based on the last A1c we changed his medication from metformin to janumet, good compliance and tolerance.  Past Medical History  Diagnosis Date  . Diabetes mellitus 05/22/07  . Anxiety    Past Surgical History  Procedure Laterality Date  . No past surgeries     History   Social History  . Marital Status: Married    Spouse Name: N/A    Number of Children: 2  . Years of Education: N/A   Occupational History  . 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 Smoking status: Never Smoker   . Smokeless tobacco: Not on file  . Alcohol Use: Yes     Comment: rarely  . Drug Use: Not on file  . Sexually Active: Not on file   Other Topics Concern  . Not on file   Social History Narrative   From [place]           Review of Systems Diet, watching carbohydrates intake No routine exercise, active at work. Takes Ambien to help with insomnia, only from time to time, the next day  he feels dizzy and slightly sleepy in the morning. Admits to drink alcohol, only during the weekends but sometimes heavy     Objective:   Physical Exam BP 140/78  Pulse 95  Temp(Src) 98.1 F (36.7 C) (Oral)  Wt 218 lb 9.6 oz (99.156 kg)  BMI 31.83 kg/m2  SpO2 97%  General -- alert, well-developed  Lungs -- normal respiratory effort, no intercostal retractions, no accessory muscle use, and normal breath sounds.   Heart-- normal rate, regular rhythm, no murmur, and no gallop.   DIABETIC FEET EXAM: No lower extremity edema Normal pedal pulses bilaterally Skin and nails are normal without calluses Pinprick examination of the feet normal. Neurologic-- alert & oriented X3 and strength normal in all extremities. Psych-- Cognition and judgment appear intact. Alert and cooperative with normal attention span and concentration.  not anxious appearing and not depressed appearing.        Assessment & Plan:

## 2012-12-06 NOTE — Assessment & Plan Note (Signed)
Uses Ambien as needed, sometimes stays sleepy the next day, decreased ambien to half tablet as needed.

## 2012-12-06 NOTE — Patient Instructions (Addendum)
regrese en 3 meses llame cuando necesite refills de sus medicinas --- Next appointment in 3 months

## 2012-12-07 LAB — AST: AST: 25 U/L (ref 0–37)

## 2012-12-07 LAB — HEMOGLOBIN A1C
Hgb A1c MFr Bld: 6.6 % — ABNORMAL HIGH (ref <5.7)
Mean Plasma Glucose: 143 mg/dL — ABNORMAL HIGH (ref <117)

## 2012-12-08 ENCOUNTER — Encounter: Payer: Self-pay | Admitting: Internal Medicine

## 2012-12-09 ENCOUNTER — Encounter: Payer: Self-pay | Admitting: *Deleted

## 2013-02-16 ENCOUNTER — Other Ambulatory Visit: Payer: Self-pay | Admitting: Internal Medicine

## 2013-02-17 NOTE — Telephone Encounter (Signed)
rx refilled per protocol. DJR  

## 2013-03-14 ENCOUNTER — Ambulatory Visit (INDEPENDENT_AMBULATORY_CARE_PROVIDER_SITE_OTHER): Payer: PRIVATE HEALTH INSURANCE | Admitting: Internal Medicine

## 2013-03-14 ENCOUNTER — Encounter: Payer: Self-pay | Admitting: Internal Medicine

## 2013-03-14 VITALS — BP 138/86 | HR 94 | Temp 98.7°F | Wt 221.0 lb

## 2013-03-14 DIAGNOSIS — E119 Type 2 diabetes mellitus without complications: Secondary | ICD-10-CM

## 2013-03-14 DIAGNOSIS — R202 Paresthesia of skin: Secondary | ICD-10-CM | POA: Insufficient documentation

## 2013-03-14 DIAGNOSIS — R209 Unspecified disturbances of skin sensation: Secondary | ICD-10-CM

## 2013-03-14 DIAGNOSIS — G4700 Insomnia, unspecified: Secondary | ICD-10-CM

## 2013-03-14 LAB — BASIC METABOLIC PANEL
CO2: 28 mEq/L (ref 19–32)
Calcium: 9.9 mg/dL (ref 8.4–10.5)
Creat: 0.87 mg/dL (ref 0.50–1.35)
Sodium: 140 mEq/L (ref 135–145)

## 2013-03-14 MED ORDER — SITAGLIPTIN PHOS-METFORMIN HCL 50-1000 MG PO TABS: ORAL_TABLET | ORAL | Status: DC

## 2013-03-14 MED ORDER — ZOLPIDEM TARTRATE 10 MG PO TABS: 10.0000 mg | ORAL_TABLET | Freq: Every evening | ORAL | Status: DC | PRN

## 2013-03-14 MED ORDER — GLIMEPIRIDE 2 MG PO TABS: 2.0000 mg | ORAL_TABLET | Freq: Every day | ORAL | Status: DC

## 2013-03-14 NOTE — Assessment & Plan Note (Addendum)
seems to be doing well on Janumet Check a BMP and A1c Refill medicines. Feet exam negative today. Did not get to see an eye doctor as recommended

## 2013-03-14 NOTE — Assessment & Plan Note (Signed)
Needs a Rf

## 2013-03-14 NOTE — Patient Instructions (Signed)
Get your blood work before you leave  Next visit in 4 months   for a physical exam    consiga "wrist splinters" o "Carpal Tunnel Splinters" uselos de noche en ambas mun~ecas

## 2013-03-14 NOTE — Progress Notes (Signed)
  Subjective:    Patient ID: Shaun Vargas, male    DOB: [birth year], 47 y.o.   MRN: [id number]  HPI Routine office visit Diabetes--good medication compliance, had a flu shot already, ambulatory blood sugars around 130. Insomnia--uses Ambien sporadically, needs a refill Having upper extremity paresthesias on and off, usually at night, mostly affecting the second and third fingers, R>L.  Past Medical History  Diagnosis Date  . Diabetes mellitus 05/22/07  . Anxiety    Past Surgical History  Procedure Laterality Date  . No past surgeries     History   Social History  . Marital Status: Married    Spouse Name: N/A    Number of Children: 2  . Years of Education: N/A   Occupational History  . 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 Smoking status: Never Smoker   . Smokeless tobacco: Not on file  . Alcohol Use: Yes     Comment: rarely  . Drug Use: Not on file  . Sexual Activity: Not on file   Other Topics Concern  . Not on file   Social History Narrative   From [place]          Review of Systems Diet-- slt better? Exercise-- no change, active at work, no routine exercise  No  CP, SOB, lower extremity edema No nausea, vomiting, diarrhea. No neck pain.     Objective:   Physical Exam BP 138/86  Pulse 94  Temp(Src) 98.7 F (37.1 C)  Wt 221 lb (100.245 kg)  BMI 32.18 kg/m2  SpO2 98% General -- alert, well-developed, NAD.  Lungs -- normal respiratory effort, no intercostal retractions, no accessory muscle use, and normal breath sounds.  Heart-- normal rate, regular rhythm, no murmur.   Extremities-- no pretibial edema bilaterally ; Hands and wrists normal to inspection on palpation DIABETIC FEET EXAM: No lower extremity edema Normal pedal pulses bilaterally Skin normal, nails normal, no calluses Pinprick examination of the feet normal. Neurologic--  alert & oriented X3. Speech normal, gait normal, strength normal in all extremities.  DTRs symmetric   Psych-- Cognition and judgment appear intact. Cooperative with normal attention span and concentration. No anxious appearing , no depressed appearing.       Assessment & Plan:

## 2013-03-15 ENCOUNTER — Encounter: Payer: Self-pay | Admitting: Internal Medicine

## 2013-03-15 LAB — HEMOGLOBIN A1C
Hgb A1c MFr Bld: 6.8 % — ABNORMAL HIGH (ref <5.7)
Mean Plasma Glucose: 148 mg/dL — ABNORMAL HIGH (ref <117)

## 2013-03-15 NOTE — Assessment & Plan Note (Signed)
Bilateral hand paresthesias as described above, CTS? Recommend splinters OTC

## 2013-03-17 ENCOUNTER — Encounter: Payer: Self-pay | Admitting: *Deleted

## 2013-07-15 ENCOUNTER — Encounter: Payer: PRIVATE HEALTH INSURANCE | Admitting: Internal Medicine

## 2013-08-04 ENCOUNTER — Telehealth: Payer: Self-pay

## 2013-08-04 NOTE — Telephone Encounter (Signed)
Phone not accepting calls

## 2013-08-04 NOTE — Telephone Encounter (Signed)
Phone not accepting calls  Flu vaccine--02/2013 Tdap--02/2012 PSA--07/2010--1.21

## 2013-08-05 ENCOUNTER — Ambulatory Visit (INDEPENDENT_AMBULATORY_CARE_PROVIDER_SITE_OTHER): Payer: PRIVATE HEALTH INSURANCE | Admitting: Internal Medicine

## 2013-08-05 ENCOUNTER — Encounter: Payer: Self-pay | Admitting: Internal Medicine

## 2013-08-05 VITALS — BP 122/81 | HR 90 | Temp 97.8°F | Ht 69.5 in | Wt 225.0 lb

## 2013-08-05 DIAGNOSIS — Z23 Encounter for immunization: Secondary | ICD-10-CM

## 2013-08-05 DIAGNOSIS — Z0000 Encounter for general adult medical examination without abnormal findings: Secondary | ICD-10-CM

## 2013-08-05 DIAGNOSIS — E119 Type 2 diabetes mellitus without complications: Secondary | ICD-10-CM

## 2013-08-05 DIAGNOSIS — G4700 Insomnia, unspecified: Secondary | ICD-10-CM

## 2013-08-05 NOTE — Patient Instructions (Signed)
Come back fasting this week, please make an appointment: CMP, FLP, CBC, PSA --- dx v70 A1c ---- dx  diabetes  Next visit in 4 months.

## 2013-08-05 NOTE — Progress Notes (Signed)
Pre visit review using our clinic review tool, if applicable. No additional management support is needed unless otherwise documented below in the visit note. 

## 2013-08-05 NOTE — Progress Notes (Signed)
Subjective:    Patient ID: Shaun Vargas, male    DOB: [birth year], 48 y.o.   MRN: [id number]  DOS:  08/05/2013 Type of  visit:  CPX No concerns     ROS Diet-- not very good  Exercise-- very little  DM--- (-) sx c/w low sugars, (-) LE paresthesias , (-) visual disturbances  No  CP, SOB Denies  nausea, vomiting diarrhea Denies  blood in the stools No GERD  Sx.  (-) cough, sputum production (-) wheezing, chest congestion No dysuria, gross hematuria, difficulty urinating  No anxiety, depression    Past Medical History  Diagnosis Date  . Diabetes mellitus 05/22/07  . Anxiety     Past Surgical History  Procedure Laterality Date  . No past surgeries      History   Social History  . Marital Status: Married    Spouse Name: N/A    Number of Children: 2  . Years of Education: N/A   Occupational History  . 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 Smoking status: Never Smoker   . Smokeless tobacco: Never Used  . Alcohol Use: Yes     Comment: rarely  . Drug Use: Not on file  . Sexual Activity: Not on file   Other Topics Concern  . Not on file   Social History Narrative   From [place]           Family History  Problem Relation Age of Onset  . Coronary artery disease Neg Hx   . Hypertension Neg Hx   . Stroke Neg Hx   . Prostate cancer Neg Hx   . Colon cancer Neg Hx   . Diabetes Neg Hx        Medication List       This list is accurate as of: 08/05/13  7:43 PM.  Always use your most recent med list.               diclofenac sodium 1 % Gel  Commonly known as:  VOLTAREN  Apply 2 g topically 3 (three) times daily.     glimepiride 2 MG tablet  Commonly known as:  AMARYL  Take 1 tablet (2 mg total) by mouth daily.     sitaGLIPtin-metformin 50-1000 MG per tablet  Commonly known as:  JANUMET  TAKE ONE TABLET BY MOUTH TWICE DAILY WITH MEALS     zolpidem 10 MG tablet  Commonly known as:  AMBIEN  Take 1 tablet (10 mg total) by mouth  at bedtime as needed for sleep.           Objective:   Physical Exam BP 122/81  Pulse 90  Temp(Src) 97.8 F (36.6 C)  Ht 5' 9.5" (1.765 m)  Wt 225 lb (102.059 kg)  BMI 32.76 kg/m2  SpO2 94% General -- alert, well-developed, NAD.  Neck --no thyromegaly , normal carotid pulse  HEENT-- Not pale.  Lungs -- normal respiratory effort, no intercostal retractions, no accessory muscle use, and normal breath sounds.  Heart-- normal rate, regular rhythm, no murmur.  Abdomen-- Not distended, good bowel sounds,soft, non-tender. Rectal-- No external abnormalities noted. Normal sphincter tone. No rectal masses or tenderness. Brown stool, Hemoccult negative  Prostate--Prostate gland firm and smooth, no enlargement, nodularity, tenderness, mass, asymmetry or induration. Extremities-- no pretibial edema bilaterally  Neurologic--  alert & oriented X3. Speech normal, gait normal, strength normal in all extremities.  Psych-- Cognition and judgment appear intact. Cooperative  with normal attention span and concentration. No anxious or depressed appearing.      Assessment & Plan:

## 2013-08-05 NOTE — Assessment & Plan Note (Addendum)
last Td 2014 pnm shot today never had a colonoscopy DRE and hemocult (-), check a PSA    Labs  Diet, exercise discussed

## 2013-08-05 NOTE — Assessment & Plan Note (Signed)
Good compliance with medications, CBGs at ~ 130, check labs. Encouraged to go back to a healthier lifestyle.

## 2013-08-05 NOTE — Assessment & Plan Note (Signed)
Takes Ambien as needed only

## 2013-08-06 ENCOUNTER — Other Ambulatory Visit (INDEPENDENT_AMBULATORY_CARE_PROVIDER_SITE_OTHER): Payer: PRIVATE HEALTH INSURANCE

## 2013-08-06 DIAGNOSIS — Z0000 Encounter for general adult medical examination without abnormal findings: Secondary | ICD-10-CM

## 2013-08-06 DIAGNOSIS — E119 Type 2 diabetes mellitus without complications: Secondary | ICD-10-CM

## 2013-08-06 LAB — CBC WITH DIFFERENTIAL/PLATELET
Basophils Absolute: 0 10*3/uL (ref 0.0–0.1)
Basophils Relative: 0.6 % (ref 0.0–3.0)
Eosinophils Absolute: 0 10*3/uL (ref 0.0–0.7)
Eosinophils Relative: 0.7 % (ref 0.0–5.0)
HCT: 41.7 % (ref 39.0–52.0)
Hemoglobin: 13.5 g/dL (ref 13.0–17.0)
LYMPHS ABS: 2 10*3/uL (ref 0.7–4.0)
Lymphocytes Relative: 33.1 % (ref 12.0–46.0)
MCHC: 32.4 g/dL (ref 30.0–36.0)
MCV: 83.2 fl (ref 78.0–100.0)
Monocytes Absolute: 0.7 10*3/uL (ref 0.1–1.0)
Monocytes Relative: 11 % (ref 3.0–12.0)
Neutro Abs: 3.2 10*3/uL (ref 1.4–7.7)
Neutrophils Relative %: 54.6 % (ref 43.0–77.0)
PLATELETS: 335 10*3/uL (ref 150.0–400.0)
RBC: 5.01 Mil/uL (ref 4.22–5.81)
RDW: 14.3 % (ref 11.5–14.6)
WBC: 5.9 10*3/uL (ref 4.5–10.5)

## 2013-08-06 LAB — COMPREHENSIVE METABOLIC PANEL
ALBUMIN: 4.4 g/dL (ref 3.5–5.2)
ALK PHOS: 59 U/L (ref 39–117)
ALT: 36 U/L (ref 0–53)
AST: 34 U/L (ref 0–37)
BILIRUBIN TOTAL: 0.7 mg/dL (ref 0.3–1.2)
BUN: 15 mg/dL (ref 6–23)
CALCIUM: 9.5 mg/dL (ref 8.4–10.5)
CO2: 24 mEq/L (ref 19–32)
Chloride: 103 mEq/L (ref 96–112)
Creatinine, Ser: 0.8 mg/dL (ref 0.4–1.5)
GFR: 115.01 mL/min (ref >60.00)
GLUCOSE: 136 mg/dL — AB (ref 70–99)
Potassium: 4.4 mEq/L (ref 3.5–5.1)
Sodium: 136 mEq/L (ref 135–145)
Total Protein: 7.7 g/dL (ref 6.0–8.3)

## 2013-08-06 LAB — LIPID PANEL
CHOL/HDL RATIO: 3
Cholesterol: 194 mg/dL (ref 0–200)
HDL: 60.2 mg/dL (ref >39.00)
LDL Cholesterol: 116 mg/dL — ABNORMAL HIGH (ref 0–99)
Triglycerides: 88 mg/dL (ref 0.0–149.0)
VLDL: 17.6 mg/dL (ref 0.0–40.0)

## 2013-08-06 LAB — PSA: PSA: 0.5 ng/mL (ref 0.10–4.00)

## 2013-08-06 LAB — HEMOGLOBIN A1C: HEMOGLOBIN A1C: 7.8 % — AB (ref 4.6–6.5)

## 2013-08-06 NOTE — Telephone Encounter (Signed)
Unable to reach prior to visit  

## 2013-08-08 ENCOUNTER — Encounter: Payer: Self-pay | Admitting: Internal Medicine

## 2013-08-08 ENCOUNTER — Telehealth: Payer: Self-pay | Admitting: Internal Medicine

## 2013-08-08 DIAGNOSIS — E119 Type 2 diabetes mellitus without complications: Secondary | ICD-10-CM

## 2013-08-08 MED ORDER — ATORVASTATIN CALCIUM 10 MG PO TABS: 10.0000 mg | ORAL_TABLET | Freq: Every day | ORAL | Status: DC

## 2013-08-08 MED ORDER — GLIMEPIRIDE 4 MG PO TABS: 4.0000 mg | ORAL_TABLET | Freq: Every day | ORAL | Status: DC

## 2013-08-08 NOTE — Telephone Encounter (Signed)
Labs discuss with the patient, will increase glimepiride dose and start Lipitor. Encouraged to improve her diet and exercise more, Come back to the office in 3 months Onalee Huaavid: Change glimepiride to 4 mg one tablet daily, call #30 and 6 refills Call Lipitor 10 mg one by mouth each bedtime, #30 and 3 refills

## 2013-08-08 NOTE — Telephone Encounter (Signed)
rx ordered.

## 2013-08-08 NOTE — Telephone Encounter (Signed)
Patient called back returning Dr Leta JunglingPaz's phone call back.

## 2013-08-19 ENCOUNTER — Telehealth: Payer: Self-pay

## 2013-08-19 NOTE — Telephone Encounter (Signed)
UDS: 08/06/2013 Negative for Ambien Low Risk per Dr Drue NovelPaz No further UDS per Dr Drue NovelPaz (only takes Ambien)

## 2013-09-09 ENCOUNTER — Encounter: Payer: Self-pay | Admitting: Internal Medicine

## 2013-10-23 ENCOUNTER — Other Ambulatory Visit: Payer: Self-pay | Admitting: Internal Medicine

## 2013-10-24 NOTE — Telephone Encounter (Signed)
Patient wife called to check on the status of her husband's refill for Janumet. Please advise.

## 2013-10-27 NOTE — Telephone Encounter (Signed)
rx sent

## 2013-12-05 ENCOUNTER — Encounter: Payer: Self-pay | Admitting: Internal Medicine

## 2013-12-05 ENCOUNTER — Ambulatory Visit (INDEPENDENT_AMBULATORY_CARE_PROVIDER_SITE_OTHER): Payer: No Typology Code available for payment source | Admitting: Internal Medicine

## 2013-12-05 VITALS — BP 114/81 | HR 98 | Temp 98.0°F | Wt 206.0 lb

## 2013-12-05 DIAGNOSIS — E785 Hyperlipidemia, unspecified: Secondary | ICD-10-CM | POA: Insufficient documentation

## 2013-12-05 DIAGNOSIS — M25539 Pain in unspecified wrist: Secondary | ICD-10-CM

## 2013-12-05 DIAGNOSIS — M25531 Pain in right wrist: Secondary | ICD-10-CM

## 2013-12-05 DIAGNOSIS — E119 Type 2 diabetes mellitus without complications: Secondary | ICD-10-CM

## 2013-12-05 NOTE — Progress Notes (Signed)
   Subjective:    Patient ID: Shaun Vargas, male    DOB: 08/04/1965, 48 y.o.   MRN: [id number]  DOS:  12/05/2013 Type of visit - description: ROV History: Diabetes, good compliance w/ medication Hyperlipidemia, based on the last cholesterol panel he started Lipitor, good compliance, no apparent side effects except for right wrist pain, reports that happened after a incident @ work.No myalgias per se   ROS Diet and exercise are better, he has lost approximately 20 pounds! No chest pain or difficulty breathing No nausea, vomiting, diarrhea or blood in the stools  Past Medical History  Diagnosis Date  . Diabetes mellitus 05/22/07  . Anxiety     Past Surgical History  Procedure Laterality Date  . No past surgeries      History   Social History  . Marital Status: Married    Spouse Name: N/A    Number of Children: 2  . Years of Education: N/A   Occupational History  . 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 Smoking status: Never Smoker   . Smokeless tobacco: Never Used  . Alcohol Use: Yes     Comment: rarely  . Drug Use: Not on file  . Sexual Activity: Not on file   Other Topics Concern  . Not on file   Social History Narrative   From [place]              Medication List       This list is accurate as of: 12/05/13 11:59 PM.  Always use your most recent med list.               atorvastatin 10 MG tablet  Commonly known as:  LIPITOR  Take 1 tablet (10 mg total) by mouth daily.     diclofenac sodium 1 % Gel  Commonly known as:  VOLTAREN  Apply 2 g topically 3 (three) times daily.     glimepiride 4 MG tablet  Commonly known as:  AMARYL  Take 1 tablet (4 mg total) by mouth daily before breakfast.     sitaGLIPtin-metformin 50-1000 MG per tablet  Commonly known as:  JANUMET  TAKE ONE TABLET BY MOUTH TWICE DAILY WITH MEALS     zolpidem 10 MG tablet  Commonly known as:  AMBIEN  Take 1 tablet (10 mg total) by mouth at bedtime as needed for  sleep.           Objective:   Physical Exam BP 114/81  Pulse 98  Temp(Src) 98 F (36.7 C)  Wt 206 lb (93.441 kg)  SpO2 97%  General -- alert, well-developed, NAD.  Lungs -- normal respiratory effort, no intercostal retractions, no accessory muscle use, and normal breath sounds.  Heart-- normal rate, regular rhythm, no murmur.  Extremities-- no pretibial edema bilaterally . Wrists normal to inspection on palpation Neurologic--  alert & oriented X3. Speech normal, gait appropriate for age, strength symmetric and appropriate for age.  Psych-- Cognition and judgment appear intact. Cooperative with normal attention span and concentration. No anxious or depressed appearing.         Assessment & Plan:   Mild right wrist pain, normal exam, recommend   Motrin as needed, Will call if not improving

## 2013-12-05 NOTE — Assessment & Plan Note (Signed)
Based on the last cholesterol panel he started Lipitor, good compliance, no apparent side effects, will get an FLP, AST, ALT

## 2013-12-05 NOTE — Patient Instructions (Signed)
Come back for  blood work next week, make an appointment: FLP AS ALT-- hyperlipidemia A1C-- diabetes    Next visit is for routine check up regards your blood sugar , blood pressure in 3-4 months  No need to come back fasting Please make an appointment

## 2013-12-05 NOTE — Assessment & Plan Note (Addendum)
Based on the last A1c, amaryl was increased, he is doing better with diet and exercise and has lost weight. Plan: Check the A1c

## 2013-12-05 NOTE — Progress Notes (Signed)
Pre visit review using our clinic review tool, if applicable. No additional management support is needed unless otherwise documented below in the visit note. 

## 2013-12-08 ENCOUNTER — Other Ambulatory Visit (INDEPENDENT_AMBULATORY_CARE_PROVIDER_SITE_OTHER): Payer: No Typology Code available for payment source

## 2013-12-08 DIAGNOSIS — E119 Type 2 diabetes mellitus without complications: Secondary | ICD-10-CM

## 2013-12-08 DIAGNOSIS — E785 Hyperlipidemia, unspecified: Secondary | ICD-10-CM

## 2013-12-09 LAB — LIPID PANEL
CHOL/HDL RATIO: 2
Cholesterol: 140 mg/dL (ref 0–200)
HDL: 60.7 mg/dL (ref >39.00)
LDL Cholesterol: 61 mg/dL (ref 0–99)
NonHDL: 79.3
TRIGLYCERIDES: 92 mg/dL (ref 0.0–149.0)
VLDL: 18.4 mg/dL (ref 0.0–40.0)

## 2013-12-09 LAB — HEMOGLOBIN A1C: HEMOGLOBIN A1C: 6.5 % (ref 4.6–6.5)

## 2013-12-09 LAB — ALT: ALT: 23 U/L (ref 0–53)

## 2013-12-09 LAB — AST: AST: 28 U/L (ref 0–37)

## 2013-12-10 ENCOUNTER — Encounter: Payer: Self-pay | Admitting: *Deleted

## 2013-12-26 ENCOUNTER — Other Ambulatory Visit: Payer: Self-pay | Admitting: Internal Medicine

## 2014-01-30 ENCOUNTER — Telehealth: Payer: Self-pay | Admitting: Internal Medicine

## 2014-01-30 NOTE — Telephone Encounter (Signed)
Please advise 

## 2014-01-30 NOTE — Telephone Encounter (Signed)
PT CALLED STATING JANUMET IS VERY EXPENSIVE HE IS REQUESTING A CHANGE OF MEDICATION DUE TO COST, PT ONLY SPEAKS SPANISH AND ASKED IF DR. [name] COULD RETURN THE CALL. PLEASE ADVISE

## 2014-02-03 NOTE — Telephone Encounter (Signed)
Plan: Please put together a new [facility] prescription in paper  paper (30 days and 12 RFs) along with a discount coupon. Leave them at the front desk; let me know when ready to pick up so I will call the patient

## 2014-02-04 MED ORDER — SITAGLIPTIN PHOS-METFORMIN HCL 50-1000 MG PO TABS: ORAL_TABLET | ORAL | Status: DC

## 2014-02-04 NOTE — Telephone Encounter (Signed)
Medication printed along with discount coupon and placed at front desk.

## 2014-02-04 NOTE — Telephone Encounter (Signed)
Patient aware.

## 2014-02-04 NOTE — Telephone Encounter (Signed)
Called, unable to leave a message

## 2014-03-06 ENCOUNTER — Encounter: Payer: Self-pay | Admitting: Internal Medicine

## 2014-03-06 ENCOUNTER — Ambulatory Visit (INDEPENDENT_AMBULATORY_CARE_PROVIDER_SITE_OTHER): Payer: No Typology Code available for payment source | Admitting: Internal Medicine

## 2014-03-06 VITALS — BP 149/99 | HR 88 | Temp 98.6°F | Wt 213.0 lb

## 2014-03-06 DIAGNOSIS — M545 Low back pain, unspecified: Secondary | ICD-10-CM

## 2014-03-06 DIAGNOSIS — R030 Elevated blood-pressure reading, without diagnosis of hypertension: Secondary | ICD-10-CM

## 2014-03-06 DIAGNOSIS — IMO0001 Reserved for inherently not codable concepts without codable children: Secondary | ICD-10-CM

## 2014-03-06 DIAGNOSIS — E119 Type 2 diabetes mellitus without complications: Secondary | ICD-10-CM

## 2014-03-06 LAB — BASIC METABOLIC PANEL
BUN: 24 mg/dL — ABNORMAL HIGH (ref 6–23)
CHLORIDE: 103 meq/L (ref 96–112)
CO2: 23 mEq/L (ref 19–32)
Calcium: 9.8 mg/dL (ref 8.4–10.5)
Creat: 0.91 mg/dL (ref 0.50–1.35)
Glucose, Bld: 72 mg/dL (ref 70–99)
Potassium: 4.5 mEq/L (ref 3.5–5.3)
Sodium: 140 mEq/L (ref 135–145)

## 2014-03-06 MED ORDER — CYCLOBENZAPRINE HCL 10 MG PO TABS: 10.0000 mg | ORAL_TABLET | Freq: Every evening | ORAL | Status: DC | PRN

## 2014-03-06 NOTE — Progress Notes (Signed)
Pre visit review using our clinic review tool, if applicable. No additional management support is needed unless otherwise documented below in the visit note. 

## 2014-03-06 NOTE — Assessment & Plan Note (Addendum)
5 days h/o back pain without radiation, This is a sporadic problem. Ibuprofen helping little during this episode. Plan: Stretching, ibuprofen-motrin, Flexeril at night,

## 2014-03-06 NOTE — Progress Notes (Signed)
   Subjective:    Patient ID: Shaun Vargas, male    DOB: 11/13/1965, 48 y.o.   MRN: [id number]  DOS:  03/06/2014 Type of visit - description : rov Interval history: Diabetes, good medication compliance, ambulatory blood sugars varies depending on his diet, range from 99 to 130, usually ~ 90s BP elevated today, no ambulatory BPs. Also back pain, on and off for 5 days, symptoms started after he twisted his torso. Taking ibuprofen without much help. No radiation, and no lower extremity paresthesias.  Labs reviewed, due for a BMP and A1c Discussed flu shot, likes to have it at work   ROS Denies fever or chills No  nausea, vomiting, diarrhea No headaches or dizziness  Past Medical History  Diagnosis Date  . Diabetes mellitus 05/22/07  . Anxiety     Past Surgical History  Procedure Laterality Date  . No past surgeries      History   Social History  . Marital Status: Married    Spouse Name: N/A    Number of Children: 2  . Years of Education: N/A   Occupational History  . 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 Smoking status: Never Smoker   . Smokeless tobacco: Never Used  . Alcohol Use: Yes     Comment: rarely  . Drug Use: Not on file  . Sexual Activity: Not on file   Other Topics Concern  . Not on file   Social History Narrative   From [place]              Medication List       This list is accurate as of: 03/06/14 11:59 PM.  Always use your most recent med list.               atorvastatin 10 MG tablet  Commonly known as:  LIPITOR  Take 1 tablet (10 mg total) by mouth daily.     cyclobenzaprine 10 MG tablet  Commonly known as:  FLEXERIL  Take 1 tablet (10 mg total) by mouth at bedtime as needed for muscle spasms.     glimepiride 4 MG tablet  Commonly known as:  AMARYL  TAKE ONE TABLET BY MOUTH ONCE DAILY BEFORE BREAKFAST     sitaGLIPtin-metformin 50-1000 MG per tablet  Commonly known as:  JANUMET  TAKE ONE TABLET BY MOUTH TWICE  DAILY WITH MEALS     zolpidem 10 MG tablet  Commonly known as:  AMBIEN  Take 1 tablet (10 mg total) by mouth at bedtime as needed for sleep.           Objective:   Physical Exam BP 149/99  Pulse 88  Temp(Src) 98.6 F (37 C) (Oral)  Wt 213 lb (96.616 kg)  SpO2 96% General -- alert, well-developed, NAD.   Lungs -- normal respiratory effort, no intercostal retractions, no accessory muscle use, and normal breath sounds.  Heart-- normal rate, regular rhythm, no murmur.   Extremities-- no pretibial edema bilaterally  Neurologic--  alert & oriented X3. Speech normal, gait appropriate for age, strength symmetric and appropriate for age.  DTRs symmetric.   Psych-- Cognition and judgment appear intact. Cooperative with normal attention span and concentration. No anxious or depressed appearing.       Assessment & Plan:   All instructions discussed in Spanish  Elevated BP, recommend to check ambulatory BPs and call me if they have more than 145/85 consistently

## 2014-03-06 NOTE — Patient Instructions (Addendum)
Get your blood work before you leave   Please come back to the office by 07-2014 for a physical exam. Come back fasting     tomese la sangre antes de salir regres en Marzo de el 2016 para un examen general (en ayunas) tomese la presion una vez por semana , si esta mas de 140/85 por favor llamenos   Para el dolor: Tome tylenol 500 mg --- 2 tabletas cada 8 horas si lo necesita siga tomando ibuprofen si lo necesita Tome FLEXERIL cuando tenmga mucho dolor ; esto es  un relajante muscular, solo de noche porque le dara suen~o haga ejercicios de [employer]abestiramiento  [facility] Website with information about home physical therapy for back pain: XULive.tnhttp://www.[facility].org/healthy-living/adult-health/multimedia/back-pain/sls-20076265?[email or web address]

## 2014-03-06 NOTE — Assessment & Plan Note (Addendum)
Good compliance with medications Plans to see eye doctor soon. Ambulatory to sugars << 130 and medication compliance very good  Plan: Labs.

## 2014-03-07 LAB — HEMOGLOBIN A1C
HEMOGLOBIN A1C: 6.4 % — AB (ref <5.7)
Mean Plasma Glucose: 137 mg/dL — ABNORMAL HIGH (ref <117)

## 2014-04-21 LAB — HM DIABETES EYE EXAM

## 2014-08-10 ENCOUNTER — Telehealth: Payer: Self-pay | Admitting: *Deleted

## 2014-08-10 NOTE — Telephone Encounter (Signed)
Unable to reach patient at time of Pre-Visit Call.  Patient has voicemail box that has not been set up.

## 2014-08-11 ENCOUNTER — Encounter: Payer: Self-pay | Admitting: Internal Medicine

## 2014-08-11 ENCOUNTER — Ambulatory Visit (INDEPENDENT_AMBULATORY_CARE_PROVIDER_SITE_OTHER): Payer: No Typology Code available for payment source | Admitting: Internal Medicine

## 2014-08-11 VITALS — BP 142/82 | HR 65 | Temp 98.2°F | Resp 16 | Ht 70.0 in | Wt 217.5 lb

## 2014-08-11 DIAGNOSIS — Z0000 Encounter for general adult medical examination without abnormal findings: Secondary | ICD-10-CM

## 2014-08-11 DIAGNOSIS — E119 Type 2 diabetes mellitus without complications: Secondary | ICD-10-CM

## 2014-08-11 LAB — BASIC METABOLIC PANEL
BUN: 19 mg/dL (ref 6–23)
CHLORIDE: 104 meq/L (ref 96–112)
CO2: 28 mEq/L (ref 19–32)
Calcium: 9.3 mg/dL (ref 8.4–10.5)
Creatinine, Ser: 0.84 mg/dL (ref 0.40–1.50)
GFR: 103.57 mL/min (ref >60.00)
Glucose, Bld: 131 mg/dL — ABNORMAL HIGH (ref 70–99)
Potassium: 4.2 mEq/L (ref 3.5–5.1)
SODIUM: 138 meq/L (ref 135–145)

## 2014-08-11 LAB — HEMOGLOBIN A1C: HEMOGLOBIN A1C: 6.8 % — AB (ref 4.6–6.5)

## 2014-08-11 LAB — LIPID PANEL
Cholesterol: 158 mg/dL (ref 0–200)
HDL: 55.7 mg/dL (ref >39.00)
LDL CALC: 90 mg/dL (ref 0–99)
NONHDL: 102.3
Total CHOL/HDL Ratio: 3
Triglycerides: 63 mg/dL (ref 0.0–149.0)
VLDL: 12.6 mg/dL (ref 0.0–40.0)

## 2014-08-11 LAB — TSH: TSH: 1.58 u[IU]/mL (ref 0.35–4.50)

## 2014-08-11 LAB — HM DIABETES FOOT EXAM: HM Diabetic Foot Exam: NORMAL

## 2014-08-11 NOTE — Progress Notes (Signed)
Subjective:    Patient ID: Shaun Vargas, male    DOB: 07/09/1965, 49 y.o.   MRN: [id number]  DOS:  08/11/2014 Type of visit - description : cpx Interval history: Med list reviewed and updated     Review of Systems Constitutional: No fever, chills. No unexplained wt changes. No unusual sweats HEENT: No dental problems,  URI type of sx x 3 days, nasal congestion, no d/c, + itchy throat No eye discharge, redness or intolerance to light Respiratory: No wheezing or difficulty breathing. + cough w/o  mucus production, sx increase at night  Cardiovascular: No CP, leg swelling or palpitations GI: no nausea, vomiting, diarrhea or abdominal pain.  No blood in the stools. No dysphagia   Endocrine: No polyphagia, polyuria or polydipsia GU: No dysuria, gross hematuria, difficulty urinating. No urinary urgency or frequency. Musculoskeletal: No joint swellings or unusual aches or pains Skin: No change in the color of the skin, palor or rash Allergic, immunologic: No environmental allergies or food allergies Neurological: No dizziness or syncope. No headaches. No diplopia, slurred speech, motor deficits, facial numbness Hematological: No enlarged lymph nodes, easy bruising or bleeding Psychiatry: No suicidal ideas, hallucinations, behavior problems or confusion. No unusual/severe anxiety or depression.     Past Medical History  Diagnosis Date  . Diabetes mellitus 05/22/07  . Anxiety     Past Surgical History  Procedure Laterality Date  . No past surgeries      History   Social History  . Marital Status: Married    Spouse Name: N/A  . Number of Children: 2  . Years of Education: N/A   Occupational History  . 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 Smoking status: Never Smoker   . Smokeless tobacco: Never Used  . Alcohol Use: Yes     Comment: rarely  . Drug Use: Not on file  . Sexual Activity: Not on file   Other Topics Concern  . Not on file   Social  History Narrative   From [place]           Family History  Problem Relation Age of Onset  . Coronary artery disease Neg Hx   . Hypertension Neg Hx   . Stroke Neg Hx   . Prostate cancer Neg Hx   . Colon cancer Neg Hx   . Diabetes Neg Hx        Medication List       This list is accurate as of: 08/11/14  8:29 PM.  Always use your most recent med list.               cyclobenzaprine 10 MG tablet  Commonly known as:  FLEXERIL  Take 1 tablet (10 mg total) by mouth at bedtime as needed for muscle spasms.     glimepiride 4 MG tablet  Commonly known as:  AMARYL  TAKE ONE TABLET BY MOUTH ONCE DAILY BEFORE BREAKFAST     sitaGLIPtin-metformin 50-1000 MG per tablet  Commonly known as:  JANUMET  TAKE ONE TABLET BY MOUTH TWICE DAILY WITH MEALS           Objective:   Physical Exam BP 142/82 mmHg  Pulse 65  Temp(Src) 98.2 F (36.8 C) (Oral)  Resp 16  Ht 5\' 10"  (1.778 m)  Wt 217 lb 8 oz (98.657 kg)  BMI 31.21 kg/m2 General:   Well developed, well nourished . NAD.  Neck:  Full range of motion. Supple.  No  thyromegaly , normal carotid pulse HEENT:  Normocephalic . Face symmetric, atraumatic; TMs normal, nose is slightly congested, throat without discharge or redness Lungs:  CTA B Normal respiratory effort, no intercostal retractions, no accessory muscle use. Heart: RRR,  no murmur.  Abdomen:  Not distended, soft, non-tender. No rebound or rigidity. No mass,organomegaly Diabetic feet exam: Normal pulses, normal pinprick examination, skin normal.  Skin: Exposed areas without rash. Not pale. Not jaundice Neurologic:  alert & oriented X3.  Speech normal, gait appropriate for age and unassisted Strength symmetric and appropriate for age.  Psych: Cognition and judgment appear intact.  Cooperative with normal attention span and concentration.  Behavior appropriate. No anxious or depressed appearing.       Assessment & Plan:

## 2014-08-11 NOTE — Progress Notes (Signed)
Pre visit review using our clinic review tool, if applicable. No additional management support is needed unless otherwise documented below in the visit note. 

## 2014-08-11 NOTE — Patient Instructions (Signed)
Get your blood work before you leave  Come back to the office in 4 months  for a routine check up    TOME MUCINEX DM 1 TABLETA 2 VECES AL DIA PARA LA TOS USE FLONASE 2 SPRAYS A CADA LADO DE LA NARIZ TODOS LOS DIAS SI NO MEJORA EN 1 SEMANA , AVISENOS   DE UNA MUESTRA DE SANGRE HOY DIA  REGRESE EN 4 MESES

## 2014-08-11 NOTE — Assessment & Plan Note (Addendum)
Td 2014 pnm shot 2015 never had a colonoscopy DRE   PSA   07-2013 neg  Labs  Diet, exercise discussed   Upper respiratory symptoms, URI versus allergies. Recommend Flonase. Mucinex. If not better , pt will call Diabetes, good medication compliance, CBGs usually 115, 109; foot exam negative Insomnia, tried Ambien, did not like how he felt, not taking it at the present time and reports he is not interested on treatment High cholesterol, tried Lipitor, did not feel any side effects but  not taking it at present, no reason; reports that he will go back on it if needed.

## 2014-08-17 ENCOUNTER — Other Ambulatory Visit: Payer: Self-pay

## 2014-08-17 MED ORDER — GLIMEPIRIDE 4 MG PO TABS: ORAL_TABLET | ORAL | Status: DC

## 2014-09-04 ENCOUNTER — Emergency Department (HOSPITAL_COMMUNITY): Payer: PRIVATE HEALTH INSURANCE

## 2014-09-04 ENCOUNTER — Encounter (HOSPITAL_COMMUNITY): Payer: Self-pay | Admitting: *Deleted

## 2014-09-04 ENCOUNTER — Emergency Department (HOSPITAL_COMMUNITY)
Admission: EM | Admit: 2014-09-04 | Discharge: 2014-09-04 | Disposition: A | Discharge status: Home / Self Care | Payer: PRIVATE HEALTH INSURANCE | Attending: Emergency Medicine | Admitting: Emergency Medicine

## 2014-09-04 DIAGNOSIS — Z8659 Personal history of other mental and behavioral disorders: Secondary | ICD-10-CM | POA: Diagnosis not present

## 2014-09-04 DIAGNOSIS — Y288XXA Contact with other sharp object, undetermined intent, initial encounter: Secondary | ICD-10-CM | POA: Diagnosis not present

## 2014-09-04 DIAGNOSIS — E119 Type 2 diabetes mellitus without complications: Secondary | ICD-10-CM | POA: Diagnosis not present

## 2014-09-04 DIAGNOSIS — Z79899 Other long term (current) drug therapy: Secondary | ICD-10-CM | POA: Insufficient documentation

## 2014-09-04 DIAGNOSIS — Y93E5 Activity, floor mopping and cleaning: Secondary | ICD-10-CM | POA: Diagnosis not present

## 2014-09-04 DIAGNOSIS — S61202A Unspecified open wound of right middle finger without damage to nail, initial encounter: Secondary | ICD-10-CM | POA: Diagnosis not present

## 2014-09-04 DIAGNOSIS — Y998 Other external cause status: Secondary | ICD-10-CM | POA: Diagnosis not present

## 2014-09-04 DIAGNOSIS — S61200A Unspecified open wound of right index finger without damage to nail, initial encounter: Secondary | ICD-10-CM | POA: Insufficient documentation

## 2014-09-04 DIAGNOSIS — Y929 Unspecified place or not applicable: Secondary | ICD-10-CM | POA: Diagnosis not present

## 2014-09-04 DIAGNOSIS — S61209A Unspecified open wound of unspecified finger without damage to nail, initial encounter: Secondary | ICD-10-CM

## 2014-09-04 MED ORDER — SULFAMETHOXAZOLE-TRIMETHOPRIM 800-160 MG PO TABS: 1.0000 | ORAL_TABLET | Freq: Two times a day (BID) | ORAL | Status: DC

## 2014-09-04 MED ORDER — CEPHALEXIN 500 MG PO CAPS: 500.0000 mg | ORAL_CAPSULE | Freq: Four times a day (QID) | ORAL | Status: DC

## 2014-09-04 MED ORDER — HYDROCODONE-ACETAMINOPHEN 5-325 MG PO TABS: 1.0000 | ORAL_TABLET | ORAL | Status: DC | PRN

## 2014-09-04 MED ORDER — LIDOCAINE HCL (PF) 1 % IJ SOLN
10.0000 mL | Freq: Once | INTRAMUSCULAR | Status: AC
  Administered 2014-09-04: 10 mL
  Filled 2014-09-04: qty 10

## 2014-09-04 MED ORDER — NAPROXEN 500 MG PO TABS: 500.0000 mg | ORAL_TABLET | Freq: Two times a day (BID) | ORAL | Status: DC

## 2014-09-04 NOTE — ED Notes (Signed)
The pt was using a weed eater earlier.  Lacerations to his rt index and middle fingers  Minimal bleeding

## 2014-09-04 NOTE — ED Notes (Signed)
Pt sts he was cleaning the grass from the bottom of the lawnmower and sliced his fingers when the metal blade moved.    Recalls tetanus shot in 2015.

## 2014-09-04 NOTE — ED Notes (Signed)
Cleaned site with betadine and sterile water

## 2014-09-04 NOTE — ED Provider Notes (Signed)
CSN: [id number]     Arrival date & time 09/04/14  1932 History  This chart was scribed for Everlene FarrierWilliam Jama Krichbaum, PA-C working with Gilda Creasehristopher J Pollina, MD by Evon Slackerrance Branch, ED Scribe. This patient was seen in room TR10C/TR10C and the patient's care was started at 7:42 PM.      Chief Complaint  Patient presents with  . Laceration   The history is provided by the patient. A language interpreter was used.   HPI Comments: Shaun Vargas is a 49 y.o. male with PMHx of DM who presents to the Emergency Department complaining of new laceration to his right 2nd and 3rd digits onset today PTA. Pt states that he cut his fingers while trying to clean the lawn mower blades. Pt states that his TDAP UTD. He shows documentation on his phone of getting it in 2015.  Pt characterizes his pain as stinging. He rates his pain at 2/10.  He has taken nothing for treatment today. He denies any weakness.  Pt doesn't report any other symptoms.    Past Medical History  Diagnosis Date  . Diabetes mellitus 05/22/07  . Anxiety    Past Surgical History  Procedure Laterality Date  . No past surgeries     Family History  Problem Relation Age of Onset  . Coronary artery disease Neg Hx   . Hypertension Neg Hx   . Stroke Neg Hx   . Prostate cancer Neg Hx   . Colon cancer Neg Hx   . Diabetes Neg Hx    History  Substance Use Topics  . Smoking status: Never Smoker   . Smokeless tobacco: Never Used  . Alcohol Use: Yes     Comment: rarely    Review of Systems  Constitutional: Negative for fever.  Skin: Positive for wound.  Neurological: Negative for weakness.  All other systems reviewed and are negative.     Allergies  Review of patient's allergies indicates no known allergies.  Home Medications   Prior to Admission medications   Medication Sig Start Date End Date Taking? Authorizing Provider  cephALEXin (KEFLEX) 500 MG capsule Take 1 capsule (500 mg total) by mouth 4 (four) times daily. 09/04/14   Everlene FarrierWilliam  Suyash Amory, PA-C  cyclobenzaprine (FLEXERIL) 10 MG tablet Take 1 tablet (10 mg total) by mouth at bedtime as needed for muscle spasms. 03/06/14   Wanda PlumpJose E [name], MD  glimepiride (AMARYL) 4 MG tablet TAKE ONE TABLET BY MOUTH ONCE DAILY BEFORE BREAKFAST 08/17/14   Wanda PlumpJose E [name], MD  HYDROcodone-acetaminophen (NORCO/VICODIN) 5-325 MG per tablet Take 1-2 tablets by mouth every 4 (four) hours as needed. 09/04/14   Everlene FarrierWilliam Quaron Delacruz, PA-C  naproxen (NAPROSYN) 500 MG tablet Take 1 tablet (500 mg total) by mouth 2 (two) times daily with a meal. 09/04/14   Everlene FarrierWilliam Jamesyn Lindell, PA-C  sitaGLIPtin-metformin (JANUMET) 50-1000 MG per tablet TAKE ONE TABLET BY MOUTH TWICE DAILY WITH MEALS 02/04/14   Wanda PlumpJose E [name], MD  sulfamethoxazole-trimethoprim (SEPTRA DS) 800-160 MG per tablet Take 1 tablet by mouth every 12 (twelve) hours. 09/04/14   Everlene FarrierWilliam Miriah Maruyama, PA-C   BP 120/74 mmHg  Pulse 94  Temp(Src) 98.1 F (36.7 C) (Oral)  Resp 16  SpO2 96%   Physical Exam  Constitutional: He appears well-developed and well-nourished. No distress.  HENT:  Head: Normocephalic and atraumatic.  Eyes: Right eye exhibits no discharge. Left eye exhibits no discharge.  Cardiovascular: Normal rate, regular rhythm, normal heart sounds and intact distal pulses.   No murmur heard. Bilateral  radial pulses are intact. Good capillary refill in his distal fingers.  Pulmonary/Chest: Effort normal and breath sounds normal. No respiratory distress. He has no wheezes. He has no rales.  Musculoskeletal:  Macerated avulsion of the soft tissue of his finger pad of his right finger #3. There is also a small superficial avulsion to his right finger #2. Bleeding is controlled. No foreign body noted. Patient able to flex at his digit #3 DIP, PIP and MCP joints.  No pain in his wrist or elbow.   Neurological: He is alert. Coordination normal.  Skin: Skin is warm and dry. No rash noted. He is not diaphoretic.  Psychiatric: He has a normal mood and affect. His behavior is normal.   Nursing note and vitals reviewed.   ED Course  NERVE BLOCK Date/Time: 09/04/2014 10:30 PM Performed by: Everlene Farrier Authorized by: Everlene Farrier Consent: Verbal consent obtained. Risks and benefits: risks, benefits and alternatives were discussed Consent given by: patient Patient understanding: patient states understanding of the procedure being performed Patient consent: the patient's understanding of the procedure matches consent given Procedure consent: procedure consent matches procedure scheduled Test results: test results available and properly labeled Site marked: the operative site was marked Imaging studies: imaging studies available Required items: required blood products, implants, devices, and special equipment available Patient identity confirmed: verbally with patient Time out: Immediately prior to procedure a "time out" was called to verify the correct patient, procedure, equipment, support staff and site/side marked as required. Indications: pain relief, wound distortion and debridement Body area: upper extremity Nerve: digital Laterality: left Patient sedated: no Preparation: Patient was prepped and draped in the usual sterile fashion. Patient position: sitting Needle gauge: 22 G Location technique: anatomical landmarks Local anesthetic: lidocaine 1% without epinephrine Anesthetic total: 3 ml Outcome: pain improved Patient tolerance: Patient tolerated the procedure well with no immediate complications   (including critical care time) DIAGNOSTIC STUDIES: Oxygen Saturation is 97% on RA, normal by my interpretation.    COORDINATION OF CARE: 8:29 PM-Discussed treatment plan with pt at bedside and pt agreed to plan.     Labs Review Labs Reviewed - No data to display  Imaging Review Dg Finger Middle Right  09/04/2014   CLINICAL DATA:  Laceration to the right middle finger while weed eating.  EXAM: RIGHT MIDDLE FINGER 2+V  COMPARISON:  None.  FINDINGS: A  complex laceration is evident within the distal ventral and radial aspect of the third digit. There is a punctate radiopaque density which could represent a small bone fragment or foreign body. No definite fracture is evident. The joints are intact.  IMPRESSION: 1. Complex laceration of the distal middle finger without a definite fracture. 2. Punctate density likely represents a small foreign body.   Electronically Signed   By: Marin Roberts M.D.   On: 09/04/2014 20:57     EKG Interpretation None      Filed Vitals:   09/04/14 1939 09/04/14 2241  BP: 135/76 120/74  Pulse: 88 94  Temp: 99.2 F (37.3 C) 98.1 F (36.7 C)  TempSrc: Oral Oral  Resp: 16 16  SpO2: 97% 96%     MDM   Meds given in ED:  Medications  lidocaine (PF) (XYLOCAINE) 1 % injection 10 mL (10 mLs Infiltration Given 09/04/14 2211)    Discharge Medication List as of 09/04/2014 10:40 PM    START taking these medications   Details  cephALEXin (KEFLEX) 500 MG capsule Take 1 capsule (500 mg total) by mouth 4 (  four) times daily., Starting 09/04/2014, Until Discontinued, Print    HYDROcodone-acetaminophen (NORCO/VICODIN) 5-325 MG per tablet Take 1-2 tablets by mouth every 4 (four) hours as needed., Starting 09/04/2014, Until Discontinued, Print    naproxen (NAPROSYN) 500 MG tablet Take 1 tablet (500 mg total) by mouth 2 (two) times daily with a meal., Starting 09/04/2014, Until Discontinued, Print    sulfamethoxazole-trimethoprim (SEPTRA DS) 800-160 MG per tablet Take 1 tablet by mouth every 12 (twelve) hours., Starting 09/04/2014, Until Discontinued, Print        Final diagnoses:  Avulsion, finger tip, initial encounter   This is a 49 year old male who presents to the emergency department after sustaining a laceration to his left index and middle finger from a lawnmower blade. He reports he was trying to clean grass out of the lawnmower when the blade cut his fingers. He reports his last tetanus shot was in 2015 and has  documentation on his phone showing this. He has a macerated avulsion of the soft tissue of his left finger #3 and a small superficial avulsion on his left finger #2. There is no repair possible as his soft tissue has been avulsed. Bleeding is controlled. He is able to flex his finger at his PIP and DIP joints. X-ray was obtained which showed no fracture. It did show a small density that could be a foreign body. Digital block preformed by me and tolerated well by the patient. Extensive irrigation done with safety cleanse and 1 liter of sterile normal saline. Bleeding well controlled. Non-stick Vaseline dressing applied and wrapped in bulky dressing. Education on wound care provided. Advised the patient to change his dressings twice a day look for infections. Prescriptions for Bactrim and Keflex. Patient given naproxen and Norco for breakthrough pain. I advised him to follow up with hand surgeon Dr. Mina Marble next week. I advised the patient to follow-up with their primary care provider this week. I advised the patient to return to the emergency department with new or worsening symptoms or new concerns. The patient verbalized understanding and agreement with plan.   This patient was discussed with and evaluated by Dr. Blinda Leatherwood who agrees with assessment and plan.  I personally performed the services described in this documentation, which was scribed in my presence. The recorded information has been reviewed and is accurate.      Everlene Farrier, PA-C 09/04/14 2314  Gilda Crease, MD 09/05/14 [phone]

## 2014-09-04 NOTE — Discharge Instructions (Signed)
Avulsin profunda en la piel (Deep Skin Avulsion) Una avulsin profunda de la piel se produce cuando todas las capas de la piel o parte de las estructuras del cuerpo se rompen y Advertising account executive afuera. Generalmente es consecuencia de una lesin grave (traumatismo). Una avulsin profunda puede daar estructuras importantes que se encuentran por debajo de la piel, como tendones, ligamentos, nervios o vasos sanguneos.  CAUSAS Muchos traumatismos pueden producir una avulsin profunda en la piel. Entre ellos se incluyen:   Lesiones por [employer].  Cadas sobre superficies irregulares.  Heridas de arma.  Quemaduras y lesiones graves por arrastre. (como un accidente de bicicleta o motocicleta). TRATAMIENTO  En algunos casos, cuando la herida es pequea y no hay lesin en las estructuras vitales (como en los nervios y vasos sanguneos), los tejidos daados podrn extirparse. Entonces la herida puede limpiarse exhaustivamente y cerrarse como en el caso de una herida menos grave.  En muchos casos, luego de que el tejido daado es extirpado y Scientist, research (medical) se ha completado, podr realizarse un injerto. Un injerto de piel es un procedimiento en el cual el cirujano quita la capa externa de piel de una zona diferente del cuerpo y la [place] para cubrir la herida Set designer. Esto puede ocurrir despus de que el tejido daado se extirpe y se complete la reparacin.  El mdico slo podr aplicar un (vendaje) en la herida. La herida se mantendr limpia lo cual llevar a la curacin. La curacin puede demorar un largo tiempo (semanas o meses) y generalmente deja una gran Radio broadcast assistant. Este tipo de tratamiento se indica slo si el mdico considera que un injerto o procedimiento similar no funcionar. Deber aplicarse la vacuna contra el ttanos si:   No recuerda cundo se coloc la vacuna la ltima vez.  Nunca recibi esta vacuna.  La lesin ha [employer]. Si le han aplicado la vacuna contra el ttanos,  el brazo podr hincharse, enrojecer y sentirse caliente al tacto. Esto es frecuente y no es un problema. Si usted necesita aplicarse la vacuna y se niega a recibirla, corre riesgo de contraer ttanos. sta es una enfermedad grave.  INSTRUCCIONES PARA EL CUIDADO DOMICILIARIO  Slo tome medicamentos de venta libre o prescriptos para Glass blower/designer, las molestias, o bajar la fiebre segn las indicaciones de su mdico.  Lave la zona suavemente la zona afectada dos veces por da con agua y [place]. Enjuague bien el jabn. Seque dando palmaditas con un pao limpio y seco. No frote la herida. Puede producirle una hemorragia.  Siga las indicaciones del mdico para saber con qu frecuencia debe cambiarse el vendaje.  Aplique un ungento y un vendaje sobre la herida segn las indicaciones.  Si el vendaje se pega, humedzcalo con [place] y retrelo suavemente.  Si el vendaje se moja, se ensucia o presenta un olor ftido, cmbielo tan pronto como pueda.  Puede tomar Collene Mares. No tome baos de inmersin, no practique natacin, no haga nada que pueda humedecer la herida hasta que se cure.  Use un antihistamnico para evitar la picazn segn las indicaciones del mdico. La herida puede picar cuando se cura. No se toque ni se rasque la herida.  Concurra a los controles con el mdico para que le retire los puntos (suturas), las grapas o las tiras [place] de la piel. SOLICITE ATENCIN MDICA SI:  Observa enrojecimiento, hinchazn o aumenta el dolor en la herida.  Observa una lnea roja que se extiende [place] o debajo de la herida  o del lugar en el que se sac piel.  Aparece pus en la herida.  Advierte un olor ftido que proviene de la herida o del vendaje.  La herida se abre (los bordes no estn unidos) luego de la remocin de las suturas.  Nota que un cuerpo extrao se asoma por la herida, como un pequeo trozo de [place], vidrio o metal.  No puede mover un dedo, si la herida es en la mano o  el pie.  Hay una zona muy hinchada alrededor de la herida que le causa dolor y adormecimiento.  El brazo, la [place], la pierna o el pie cambian de color. SOLICITE ATENCIN MDICA INMEDIATAMENTE SI:  El dolor no se [place] o se agrava, an recibiendo analgsicos.  Tiene fiebre.  Tuvo nuseas o ha vomitado durante ms de 24 horas.  Siente mareos, una debilidad inusual o siente que va a desmayarse.  Tiene dolor en el pecho o dificultad para respirar. ASEGRESE QUE:   Comprende estas instrucciones.  Controlar su enfermedad.  Solicitar ayuda de inmediato si no mejora o si empeora. Document Released: 08/31/2008 Document Revised: 08/07/2011 [facility] Patient Information 2015 [place]. This information is not intended to replace advice given to you by your health care provider. Make sure you discuss any questions you have with your health care provider. Cambio del vendaje  (Dressing Change) Un vendaje es un material que se coloca sobre las heridas. Mantiene la herida limpia, seca y la protege de otras lesiones. Proporciona un ambiente que favorece la curacin de la herida.  ANTES DE COMENZAR  Rena todos los elementos. Las cosas que puede necesitar son: Solucin salina. Vendaje de gasa flexible. Crema medicinal. Ferne Reus. Guantes. Apsitos abdominales Cuadrados de gasa. Bolsas plsticas. Tome un analgsico 30 minutos antes de cambiar el vendaje, si lo necesita. Tome una ducha antes de Archivist cambio del da. Use una bolsa o vendaje plstico para evitar que el vendaje se moje. COMO RETIRAR EL VENDAJE VIEJO  Lave sus manos con agua y [place]. Squelas con una toalla limpia. Pngase los guantes. Retire la Ferne Reus. Retire con cuidado el vendaje usado. Si el vendaje se pega, puede humedecerlo con agua tibia para aflojarlo o seguir las directivas especficas del mdico. Retire todas las gasas o cintas adhesivas de la herida. Qutese los guantes. Ponga los  guantes, la cinta [place] y la gasa, en una bolsa de plstico. CAMBIO DEL VENDAJE  Abra los suministros. Saque la tapa de la solucin salina. Abra el paquete de gasas de modo que la gasa quede en el interior del paquete. Pngase los guantes. Limpie su herida Programmer, systems. Si le aconsejaron que mantenga la herida seca, siga esas instrucciones. Su mdico le puede pedir que haga alguna o ms de las siguientes cosas: Tomar la gasa. Verter la solucin salina [employer] gasa. Escurrir la solucin salina. Aplicar crema medicinal u otro medicamento en la herida , si se lo han indicado. Poner una gasa empapada en solucin slo sobre la herida, no en la piel que la rodea. Limpiar la herida segn se lo indique el mdico. Poner una gasa seca sobre la herida. Colocar el apsito abdominal sobre la gasa seca si los vendajes se empapan. Pegar el apsito abdominal en su lugar para que no se caiga. No rodee completamente la zona afectada (brazo, pierna, abdomen) con el vendaje. Envuelva el apsito con un vendaje de gasa flexible para Copywriter, advertising. Qutese los guantes. Colquelos en la bolsa plstica  junto con el vendaje viejo. Cierre la bolsa y deschela. Mantenga el vendaje limpio y seco hasta el prximo cambio de vendaje. Lvese bien las manos. SOLICITE ATENCIN MDICA SI:  La piel que rodea la herida est roja. Siente que la herida est ms sensible o le duele. Observa que sale pus de la herida. La herida huele mal. Tiene fiebre. La piel que rodea la herida tiene una erupcin roja que le pica y le arde. Observa piel negra o amarilla en la herida que no estaba antes. Tiene nuseas, vomita y se siente muy cansado. Document Released: 05/15/2005 Document Revised: 08/07/2011 [facility] Patient Information 2015 [place]. This information is not intended to replace advice given to you by your health care provider. Make sure you discuss any questions you have with your health care  provider.

## 2014-09-04 NOTE — ED Provider Notes (Signed)
Patient presented to the ER with finger laceration. Patient cut his finger on a lawnmower blade.  Face to face Exam: HEENT - PERRLA Lungs - CTAB Heart - RRR, no M/R/G Abd - S/NT/ND Neuro - alert, oriented x3 Skin - macerated avulsion of soft tissue off of finger pad of middle finger, no repair possible; small avulsion of skin off the finger pad of the index finger  Plan:  Local wound care, no repair possible. Follow-up with hand surgery in office, return for signs of infection. Will cover with antibiotics.  Gilda Creasehristopher J Annora Guderian, MD 09/04/14 2041

## 2014-11-05 ENCOUNTER — Other Ambulatory Visit: Payer: Self-pay

## 2014-12-11 ENCOUNTER — Encounter: Payer: Self-pay | Admitting: Internal Medicine

## 2014-12-11 ENCOUNTER — Ambulatory Visit (INDEPENDENT_AMBULATORY_CARE_PROVIDER_SITE_OTHER): Payer: PRIVATE HEALTH INSURANCE | Admitting: Internal Medicine

## 2014-12-11 VITALS — BP 132/76 | HR 96 | Temp 98.2°F | Ht 70.0 in | Wt 214.2 lb

## 2014-12-11 DIAGNOSIS — Z0000 Encounter for general adult medical examination without abnormal findings: Secondary | ICD-10-CM

## 2014-12-11 DIAGNOSIS — E785 Hyperlipidemia, unspecified: Secondary | ICD-10-CM | POA: Diagnosis not present

## 2014-12-11 DIAGNOSIS — E119 Type 2 diabetes mellitus without complications: Secondary | ICD-10-CM

## 2014-12-11 MED ORDER — SITAGLIPTIN PHOS-METFORMIN HCL 50-1000 MG PO TABS: 1.0000 | ORAL_TABLET | Freq: Two times a day (BID) | ORAL | Status: DC

## 2014-12-11 MED ORDER — GLIMEPIRIDE 4 MG PO TABS: 4.0000 mg | ORAL_TABLET | Freq: Every day | ORAL | Status: DC

## 2014-12-11 NOTE — Progress Notes (Signed)
   Subjective:    Patient ID: Shaun Vargas, male    DOB: 08/24/1965, 49 y.o.   MRN: [id number]  DOS:  12/11/2014 Type of visit - description : Routine visit Interval history: Diabetes, good compliance of medication, he remains very active, diet is regular. Not taking Lipitor.    Review of Systems Denies chest pain or difficulty breathing No nausea, vomiting, diarrhea  Past Medical History  Diagnosis Date  . Diabetes mellitus 05/22/07  . Anxiety     Past Surgical History  Procedure Laterality Date  . No past surgeries      History   Social History  . Marital Status: Married    Spouse Name: N/A  . Number of Children: 2  . Years of Education: N/A   Occupational History  . 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 Smoking status: Never Smoker   . Smokeless tobacco: Never Used  . Alcohol Use: Yes     Comment: rarely  . Drug Use: Not on file  . Sexual Activity: Not on file   Other Topics Concern  . Not on file   Social History Narrative   From [place]              Medication List       This list is accurate as of: 12/11/14 11:59 PM.  Always use your most recent med list.               glimepiride 4 MG tablet  Commonly known as:  AMARYL  Take 1 tablet (4 mg total) by mouth daily with breakfast.     sitaGLIPtin-metformin 50-1000 MG per tablet  Commonly known as:  JANUMET  Take 1 tablet by mouth 2 (two) times daily with a meal.           Objective:   Physical Exam BP 132/76 mmHg  Pulse 96  Temp(Src) 98.2 F (36.8 C) (Oral)  Ht 5\' 10"  (1.778 m)  Wt 214 lb 4 oz (97.183 kg)  BMI 30.74 kg/m2  SpO2 96% General:   Well developed, well nourished . NAD.  HEENT:  Normocephalic . Face symmetric, atraumatic Lungs:  CTA B Normal respiratory effort, no intercostal retractions, no accessory muscle use. Heart: RRR,  no murmur.  No pretibial edema bilaterally  Skin: Not pale. Not jaundice Neurologic:  alert & oriented X3.  Speech  normal, gait appropriate for age and unassisted Psych--  Cognition and judgment appear intact.  Cooperative with normal attention span and concentration.  Behavior appropriate. No anxious or depressed appearing.      Assessment & Plan:

## 2014-12-11 NOTE — Patient Instructions (Signed)
Get your blood work before you leave    

## 2014-12-11 NOTE — Assessment & Plan Note (Addendum)
Seems to be well-controlled, continue with medication, a cupon  was provided for janumet. Check the A1c and microalbumin

## 2014-12-11 NOTE — Progress Notes (Signed)
Pre visit review using our clinic review tool, if applicable. No additional management support is needed unless otherwise documented below in the visit note. 

## 2014-12-11 NOTE — Assessment & Plan Note (Signed)
Last FLP satisfactory without statins. No change

## 2014-12-12 LAB — MICROALBUMIN / CREATININE URINE RATIO
CREATININE, URINE: 332.3 mg/dL
Microalb Creat Ratio: 13.8 mg/g (ref 0.0–30.0)
Microalb, Ur: 4.6 mg/dL — ABNORMAL HIGH (ref <2.0)

## 2014-12-12 LAB — HEMOGLOBIN A1C
HEMOGLOBIN A1C: 7 % — AB (ref <5.7)
Mean Plasma Glucose: 154 mg/dL — ABNORMAL HIGH (ref <117)

## 2014-12-12 LAB — HIV ANTIBODY (ROUTINE TESTING W REFLEX): HIV 1&2 Ab, 4th Generation: NONREACTIVE

## 2014-12-15 MED ORDER — GLIMEPIRIDE 4 MG PO TABS: 8.0000 mg | ORAL_TABLET | Freq: Every day | ORAL | Status: DC

## 2014-12-15 NOTE — Addendum Note (Signed)
Addended by: Dorette GrateFAULKNER, Sundeep Cary C on: 12/15/2014 04:57 PM   Modules accepted: Orders

## 2015-02-12 ENCOUNTER — Telehealth: Payer: Self-pay | Admitting: Internal Medicine

## 2015-02-12 NOTE — Telephone Encounter (Signed)
Please inform Pt that I do not have any Janumet discount coupons. He may be able to find one online he can print off, also recommend the website GoodRx.com, he can look up his prescription and it will tell him the cheapest place to have Janumet filled in the area.

## 2015-02-12 NOTE — Telephone Encounter (Signed)
Pt was called and informed the below, pt wife understood.

## 2015-02-12 NOTE — Telephone Encounter (Signed)
Caller name: Byrd Hesselbach  Relation to pt:pt's wife  Call back number: [phone] [facility]: [facility] - [place], [place] - [address]  Reason for call: Wife Byrd Hesselbach) states pt is needing cupon for discount for his meds  sitaGLIPtin-metformin (JANUMET) 50-1000 MG per tablet, if we have any cuponsplease let him know so pt can come to office to pick up, since insurance does not cover meds completely and it is really expensive. Please advice.

## 2015-02-13 ENCOUNTER — Other Ambulatory Visit: Payer: Self-pay | Admitting: Internal Medicine

## 2015-03-19 ENCOUNTER — Encounter: Payer: Self-pay | Admitting: Internal Medicine

## 2015-03-19 ENCOUNTER — Ambulatory Visit (INDEPENDENT_AMBULATORY_CARE_PROVIDER_SITE_OTHER): Payer: PRIVATE HEALTH INSURANCE | Admitting: Internal Medicine

## 2015-03-19 VITALS — BP 132/72 | HR 87 | Temp 98.2°F | Ht 70.0 in | Wt 216.5 lb

## 2015-03-19 DIAGNOSIS — E119 Type 2 diabetes mellitus without complications: Secondary | ICD-10-CM

## 2015-03-19 DIAGNOSIS — Z09 Encounter for follow-up examination after completed treatment for conditions other than malignant neoplasm: Secondary | ICD-10-CM

## 2015-03-19 DIAGNOSIS — Z23 Encounter for immunization: Secondary | ICD-10-CM | POA: Diagnosis not present

## 2015-03-19 LAB — HEMOGLOBIN A1C
Hgb A1c MFr Bld: 6.8 % — ABNORMAL HIGH (ref <5.7)
MEAN PLASMA GLUCOSE: 148 mg/dL — AB (ref <117)

## 2015-03-19 NOTE — Progress Notes (Signed)
   Subjective:    Patient ID: Shaun Vargas, male    DOB: 06/10/1965, 49 y.o.   MRN: [id number]  DOS:  03/19/2015 Type of visit - description : Routine visit Interval history: Last A1c was elevated, glimepiride dose was increased, CBGs used to be 115, 120 in the morning, now 90, 87.   Review of Systems  Denies chest pain, difficulty breathing. No nausea, vomiting, diarrhea. No symptoms consistent with hypoglycemia.  Past Medical History  Diagnosis Date  . Diabetes mellitus 05/22/07  . Anxiety     Past Surgical History  Procedure Laterality Date  . No past surgeries      Social History   Social History  . Marital Status: Married    Spouse Name: N/A  . Number of Children: 2  . Years of Education: N/A   Occupational History  . 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 Smoking status: Never Smoker   . Smokeless tobacco: Never Used  . Alcohol Use: Yes     Comment: rarely  . Drug Use: Not on file  . Sexual Activity: Not on file   Other Topics Concern  . Not on file   Social History Narrative   From [place]              Medication List       This list is accurate as of: 03/19/15 11:59 PM.  Always use your most recent med list.               glimepiride 4 MG tablet  Commonly known as:  AMARYL  Take 2 tablets (8 mg total) by mouth daily with breakfast.     sitaGLIPtin-metformin 50-1000 MG tablet  Commonly known as:  JANUMET  Take 1 tablet by mouth 2 (two) times daily with a meal.           Objective:   Physical Exam BP 132/72 mmHg  Pulse 87  Temp(Src) 98.2 F (36.8 C) (Oral)  Ht 5\' 10"  (1.778 m)  Wt 216 lb 8 oz (98.204 kg)  BMI 31.06 kg/m2  SpO2 95%  General:   Well developed, well nourished . NAD.  HEENT:  Normocephalic . Face symmetric, atraumatic Lungs:  CTA B Normal respiratory effort, no intercostal retractions, no accessory muscle use. Heart: RRR,  no murmur.  No pretibial edema bilaterally  Skin: Not pale. Not  jaundice Neurologic:  alert & oriented X3.  Speech normal, gait appropriate for age and unassisted Psych--  Cognition and judgment appear intact.  Cooperative with normal attention span and concentration.  Behavior appropriate. No anxious or depressed appearing.      Assessment & Plan:     Assessment > DM 2008   hyperlipidemia Anxiety   Plan  DM: Last A1c elevated, glimepiride increased to 8 mg, he also takes Janumet twice a day. No evidence of low blood sugars. We'll get a A1c, continue with present care. States is having trouble with his insurance covering labs, will call for a letter of support if needed. Primary care: prevnar today RTC 4 months

## 2015-03-19 NOTE — Patient Instructions (Signed)
Get your blood work before you leave   Next visit  for a  routine visit in 4 months. No fasting.   Please schedule an appointment at the front desk

## 2015-03-19 NOTE — Progress Notes (Signed)
Pre visit review using our clinic review tool, if applicable. No additional management support is needed unless otherwise documented below in the visit note. 

## 2015-03-20 DIAGNOSIS — Z09 Encounter for follow-up examination after completed treatment for conditions other than malignant neoplasm: Secondary | ICD-10-CM | POA: Insufficient documentation

## 2015-03-20 NOTE — Assessment & Plan Note (Signed)
DM: Last A1c elevated, glimepiride increased to 8 mg, he also takes Janumet twice a day. No evidence of low blood sugars. We'll get a A1c, continue with present care. States is having trouble with his insurance covering labs, will call for a letter of support if needed. Primary care: prevnar today RTC 4 months

## 2015-04-27 LAB — HM DIABETES EYE EXAM

## 2015-05-04 ENCOUNTER — Encounter: Payer: Self-pay | Admitting: Internal Medicine

## 2015-05-04 ENCOUNTER — Other Ambulatory Visit: Payer: Self-pay | Admitting: Internal Medicine

## 2015-06-14 ENCOUNTER — Ambulatory Visit (INDEPENDENT_AMBULATORY_CARE_PROVIDER_SITE_OTHER): Payer: PRIVATE HEALTH INSURANCE | Admitting: Internal Medicine

## 2015-06-14 ENCOUNTER — Encounter: Payer: Self-pay | Admitting: Internal Medicine

## 2015-06-14 VITALS — BP 124/78 | HR 90 | Temp 97.7°F | Ht 70.0 in | Wt 223.0 lb

## 2015-06-14 DIAGNOSIS — E119 Type 2 diabetes mellitus without complications: Secondary | ICD-10-CM | POA: Diagnosis not present

## 2015-06-14 MED ORDER — GLIMEPIRIDE 4 MG PO TABS: 8.0000 mg | ORAL_TABLET | Freq: Every day | ORAL | Status: DC

## 2015-06-14 MED ORDER — SITAGLIPTIN PHOS-METFORMIN HCL 50-1000 MG PO TABS: 1.0000 | ORAL_TABLET | Freq: Two times a day (BID) | ORAL | Status: DC

## 2015-06-14 NOTE — Progress Notes (Signed)
Pre visit review using our clinic review tool, if applicable. No additional management support is needed unless otherwise documented below in the visit note. 

## 2015-06-14 NOTE — Progress Notes (Signed)
   Subjective:    Patient ID: Shaun Vargas, male    DOB: 08/09/1965, 50 y.o.   MRN: [id number]  DOS:  06/14/2015 Type of visit - description : Routine office visit Interval history: Since the last office visit, he is doing well. Good compliance with medications, CBGs in the mornings range from 120-140. Usually depends on his diet.   Review of Systems Diet was okay except around the holidays He remains active at work but otherwise no exercise Denies chest pain or difficulty breathing No nausea, vomiting, diarrhea  Past Medical History  Diagnosis Date  . Diabetes mellitus 05/22/07  . Anxiety     Past Surgical History  Procedure Laterality Date  . No past surgeries      Social History   Social History  . Marital Status: Married    Spouse Name: N/A  . Number of Children: 2  . Years of Education: N/A   Occupational History  . 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 Smoking status: Never Smoker   . Smokeless tobacco: Never Used  . Alcohol Use: Yes     Comment: rarely  . Drug Use: Not on file  . Sexual Activity: Not on file   Other Topics Concern  . Not on file   Social History Narrative   From [place]              Medication List       This list is accurate as of: 06/14/15 11:59 PM.  Always use your most recent med list.               aspirin 81 MG tablet  Take 81 mg by mouth daily.     glimepiride 4 MG tablet  Commonly known as:  AMARYL  Take 2 tablets (8 mg total) by mouth daily with breakfast.     sitaGLIPtin-metformin 50-1000 MG tablet  Commonly known as:  JANUMET  Take 1 tablet by mouth 2 (two) times daily with a meal.           Objective:   Physical Exam BP 124/78 mmHg  Pulse 90  Temp(Src) 97.7 F (36.5 C) (Oral)  Ht 5\' 10"  (1.778 m)  Wt 223 lb (101.152 kg)  BMI 32.00 kg/m2  SpO2 95% General:   Well developed, well nourished . NAD.  HEENT:  Normocephalic . Face symmetric, atraumatic Lungs:  CTA B Normal  respiratory effort, no intercostal retractions, no accessory muscle use. Heart: RRR,  no murmur.  No pretibial edema bilaterally  Skin: Not pale. Not jaundice Neurologic:  alert & oriented X3.  Speech normal, gait appropriate for age and unassisted Psych--  Cognition and judgment appear intact.  Cooperative with normal attention span and concentration.  Behavior appropriate. No anxious or depressed appearing.      Assessment & Plan:   Assessment > DM 2008  Hyperlipidemia Anxiety   Plan  DM: Good compliance of medications, check A1c, encouraged to go back to a normal diet now that the holidays are over. Primary care: Had a flu shot-2016, also recommend to start taking aspirin 81 mg for cardiovascular protection. RTC 3-4 months for a physical

## 2015-06-14 NOTE — Patient Instructions (Signed)
BEFORE YOU LEAVE THE OFFICE:  GO TO THE LAB  Get the blood work    GO TO THE FRONT DESK Schedule a complete physical exam to be done in 3-4 months  Please be fasting   REGRESE EN 3-4 MESES PARA UN EXAMEN FISICO, REGRESE EN AYUNAS

## 2015-06-15 LAB — BASIC METABOLIC PANEL
BUN: 19 mg/dL (ref 6–23)
CALCIUM: 9 mg/dL (ref 8.4–10.5)
CO2: 29 meq/L (ref 19–32)
CREATININE: 0.86 mg/dL (ref 0.40–1.50)
Chloride: 105 mEq/L (ref 96–112)
GFR: 100.44 mL/min (ref >60.00)
GLUCOSE: 135 mg/dL — AB (ref 70–99)
Potassium: 4.2 mEq/L (ref 3.5–5.1)
Sodium: 141 mEq/L (ref 135–145)

## 2015-06-15 LAB — HEMOGLOBIN A1C: Hgb A1c MFr Bld: 7.2 % — ABNORMAL HIGH (ref 4.6–6.5)

## 2015-06-30 ENCOUNTER — Telehealth: Payer: Self-pay | Admitting: Internal Medicine

## 2015-06-30 ENCOUNTER — Other Ambulatory Visit: Payer: Self-pay | Admitting: Internal Medicine

## 2015-06-30 MED ORDER — SITAGLIPTIN PHOS-METFORMIN HCL 50-1000 MG PO TABS: 1.0000 | ORAL_TABLET | Freq: Two times a day (BID) | ORAL | Status: DC

## 2015-06-30 NOTE — Telephone Encounter (Signed)
[id number] [place], [place] - [address] [phone] (Phone) [phone] (Fax)       Pharmacy states they did not receive this rx on 1/16 and request that it be re-sent (sitaGLIPtin-metformin (JANUMET) 50-1000 MG tablet [[id number]] )

## 2015-06-30 NOTE — Telephone Encounter (Signed)
Rx resent.

## 2015-11-05 ENCOUNTER — Ambulatory Visit (INDEPENDENT_AMBULATORY_CARE_PROVIDER_SITE_OTHER): Payer: BLUE CROSS/BLUE SHIELD | Admitting: Internal Medicine

## 2015-11-05 ENCOUNTER — Encounter: Payer: Self-pay | Admitting: Internal Medicine

## 2015-11-05 VITALS — BP 126/76 | HR 80 | Temp 97.5°F | Ht 70.0 in | Wt 212.0 lb

## 2015-11-05 DIAGNOSIS — Z0000 Encounter for general adult medical examination without abnormal findings: Secondary | ICD-10-CM | POA: Diagnosis not present

## 2015-11-05 DIAGNOSIS — E118 Type 2 diabetes mellitus with unspecified complications: Secondary | ICD-10-CM | POA: Diagnosis not present

## 2015-11-05 LAB — CBC WITH DIFFERENTIAL/PLATELET
BASOS ABS: 0 10*3/uL (ref 0.0–0.1)
Basophils Relative: 0.8 % (ref 0.0–3.0)
EOS ABS: 0 10*3/uL (ref 0.0–0.7)
Eosinophils Relative: 0.3 % (ref 0.0–5.0)
HCT: 42.5 % (ref 39.0–52.0)
Hemoglobin: 13.7 g/dL (ref 13.0–17.0)
LYMPHS ABS: 2.1 10*3/uL (ref 0.7–4.0)
Lymphocytes Relative: 39 % (ref 12.0–46.0)
MCHC: 32.3 g/dL (ref 30.0–36.0)
MCV: 83.4 fl (ref 78.0–100.0)
MONO ABS: 0.6 10*3/uL (ref 0.1–1.0)
Monocytes Relative: 11.4 % (ref 3.0–12.0)
NEUTROS ABS: 2.6 10*3/uL (ref 1.4–7.7)
NEUTROS PCT: 48.5 % (ref 43.0–77.0)
PLATELETS: 307 10*3/uL (ref 150.0–400.0)
RBC: 5.1 Mil/uL (ref 4.22–5.81)
RDW: 14.3 % (ref 11.5–15.5)
WBC: 5.3 10*3/uL (ref 4.0–10.5)

## 2015-11-05 LAB — COMPREHENSIVE METABOLIC PANEL
ALT: 27 U/L (ref 0–53)
AST: 27 U/L (ref 0–37)
Albumin: 4.5 g/dL (ref 3.5–5.2)
Alkaline Phosphatase: 64 U/L (ref 39–117)
BUN: 26 mg/dL — ABNORMAL HIGH (ref 6–23)
CALCIUM: 9.5 mg/dL (ref 8.4–10.5)
CHLORIDE: 104 meq/L (ref 96–112)
CO2: 28 meq/L (ref 19–32)
CREATININE: 0.9 mg/dL (ref 0.40–1.50)
GFR: 95.16 mL/min (ref >60.00)
Glucose, Bld: 123 mg/dL — ABNORMAL HIGH (ref 70–99)
Potassium: 4 mEq/L (ref 3.5–5.1)
Sodium: 139 mEq/L (ref 135–145)
Total Bilirubin: 0.6 mg/dL (ref 0.2–1.2)
Total Protein: 7.8 g/dL (ref 6.0–8.3)

## 2015-11-05 LAB — LIPID PANEL
CHOL/HDL RATIO: 3
Cholesterol: 175 mg/dL (ref 0–200)
HDL: 55.5 mg/dL (ref >39.00)
LDL CALC: 108 mg/dL — AB (ref 0–99)
NonHDL: 119.86
TRIGLYCERIDES: 58 mg/dL (ref 0.0–149.0)
VLDL: 11.6 mg/dL (ref 0.0–40.0)

## 2015-11-05 LAB — MICROALBUMIN / CREATININE URINE RATIO
CREATININE, U: 280.4 mg/dL
MICROALB UR: 3 mg/dL — AB (ref 0.0–1.9)
Microalb Creat Ratio: 1.1 mg/g (ref 0.0–30.0)

## 2015-11-05 LAB — HEMOGLOBIN A1C: Hgb A1c MFr Bld: 6.8 % — ABNORMAL HIGH (ref 4.6–6.5)

## 2015-11-05 NOTE — Progress Notes (Signed)
Subjective:    Patient ID: Shaun Vargas, male    DOB: 10/11/1965, 50 y.o.   MRN: [id number]  DOS:  11/05/2015 Type of visit - description : CPX Interval history: No major concerns, good compliance of medication, CBGs varies according to the diet the previous day. This morning CBG was 102. Admits that is a lot of room for improvement in his diet, he remains very active at work    Review of Systems Constitutional: No fever. No chills. No unexplained wt changes. No unusual sweats  HEENT: No dental problems, no ear discharge, no facial swelling, no voice changes. No eye discharge, no eye  redness , no  intolerance to light   Respiratory: No wheezing , no  difficulty breathing. No cough , no mucus production  Cardiovascular: No CP, no leg swelling , no  Palpitations  GI: no nausea, no vomiting, no diarrhea , no  abdominal pain.  No blood in the stools. No dysphagia, no odynophagia    Endocrine: No polyphagia, no polyuria , no polydipsia  GU: No dysuria, gross hematuria, difficulty urinating. No urinary urgency, no frequency.  Musculoskeletal: Occasionally right knee pain anteriorly for a while. Not a major problem to the patient.  Skin: No change in the color of the skin, palor , no  Rash  Allergic, immunologic: No environmental allergies , no  food allergies  Neurological: No dizziness no  syncope. No headaches. No diplopia, no slurred, no slurred speech, no motor deficits, no facial  Numbness  Hematological: No enlarged lymph nodes, no easy bruising , no unusual bleedings  Psychiatry: No suicidal ideas, no hallucinations, no beavior problems, no confusion.  No unusual/severe anxiety, no depression   Past Medical History  Diagnosis Date  . Diabetes mellitus 05/22/07  . Anxiety     Past Surgical History  Procedure Laterality Date  . No past surgeries      Social History   Social History  . Marital Status: Married    Spouse Name: N/A  . Number of Children: 2  .  Years of Education: N/A   Occupational History  . 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 Smoking status: Never Smoker   . Smokeless tobacco: Never Used  . Alcohol Use: Yes     Comment: rarely  . Drug Use: Not on file  . Sexual Activity: Not on file   Other Topics Concern  . Not on file   Social History Narrative   From [place]   Lives w/ wife, wife's son and mother   His children are in [place]     Family History  Problem Relation Age of Onset  . Coronary artery disease Neg Hx   . Hypertension Neg Hx   . Stroke Neg Hx   . Prostate cancer Neg Hx   . Colon cancer Neg Hx   . Diabetes Neg Hx        Medication List       This list is accurate as of: 11/05/15 11:59 PM.  Always use your most recent med list.               aspirin 81 MG tablet  Take 81 mg by mouth daily.     glimepiride 4 MG tablet  Commonly known as:  AMARYL  Take 2 tablets (8 mg total) by mouth daily with breakfast.     sitaGLIPtin-metformin 50-1000 MG tablet  Commonly known as:  JANUMET  Take 1 tablet  by mouth 2 (two) times daily with a meal.           Objective:   Physical Exam BP 126/76 mmHg  Pulse 80  Temp(Src) 97.5 F (36.4 C) (Oral)  Ht  (1.778 m)  Wt 212 lb (96.163 kg)  BMI 30.42 kg/m2  SpO2 98%  General:   Well developed, well nourished . NAD.  Neck: No  thyromegaly  HEENT:  Normocephalic . Face symmetric, atraumatic Lungs:  CTA B Normal respiratory effort, no intercostal retractions, no accessory muscle use. Heart: RRR,  no murmur.  No pretibial edema bilaterally  Abdomen:  Not distended, soft, non-tender. No rebound or rigidity.   Skin: Exposed areas without rash. Not pale. Not jaundice DIABETIC FEET EXAM: No lower extremity edema Normal pedal pulses bilaterally Skin normal, nails normal, no calluses Pinprick examination of the feet normal. Neurologic:  alert & oriented X3.  Speech normal, gait appropriate for age and unassisted Strength symmetric  and appropriate for age.  Psych: Cognition and judgment appear intact.  Cooperative with normal attention span and concentration.  Behavior appropriate. No anxious or depressed appearing.    Assessment & Plan:   Assessment > DM 2008  Hyperlipidemia Anxiety   PLAN: DM: Continue glimepiride, Janumet. Foot exam negative today. Diet needs to improve, states he knows what to do, does not need more information, has problems managing carbohydrate intake. Ideas suggested to the patient such as portion control. Will check A1c, micral. Consider Actos. RTC 3 months

## 2015-11-05 NOTE — Assessment & Plan Note (Signed)
Td 2014; pnm shot 2015; prevnar 02-2015 never had a colonoscopy DRE   PSA   07-2013 neg  Labs  Diet, exercise discussed

## 2015-11-05 NOTE — Progress Notes (Signed)
Pre visit review using our clinic review tool, if applicable. No additional management support is needed unless otherwise documented below in the visit note. 

## 2015-11-05 NOTE — Patient Instructions (Signed)
GO TO THE LAB : Get the blood work     GO TO THE FRONT DESK Schedule your next appointment for a routine check up in 3 months, no fasting    Antes de irse saque una cita para un chequeo en 3 meses , no tiene que estar en [employer]

## 2015-11-06 NOTE — Assessment & Plan Note (Signed)
DM: Continue glimepiride, Janumet. Foot exam negative today. Diet needs to improve, states he knows what to do, does not need more information, has problems managing carbohydrate intake. Ideas suggested to the patient such as portion control. Will check A1c, micral. Consider Actos. RTC 3 months

## 2016-02-07 ENCOUNTER — Encounter: Payer: Self-pay | Admitting: Internal Medicine

## 2016-02-07 ENCOUNTER — Ambulatory Visit (INDEPENDENT_AMBULATORY_CARE_PROVIDER_SITE_OTHER): Payer: BLUE CROSS/BLUE SHIELD | Admitting: Internal Medicine

## 2016-02-07 VITALS — BP 120/70 | HR 69 | Temp 97.8°F | Resp 12 | Ht 70.0 in | Wt 208.4 lb

## 2016-02-07 DIAGNOSIS — Z23 Encounter for immunization: Secondary | ICD-10-CM | POA: Diagnosis not present

## 2016-02-07 DIAGNOSIS — R202 Paresthesia of skin: Secondary | ICD-10-CM | POA: Diagnosis not present

## 2016-02-07 DIAGNOSIS — E119 Type 2 diabetes mellitus without complications: Secondary | ICD-10-CM

## 2016-02-07 MED ORDER — GLIMEPIRIDE 4 MG PO TABS: 8.0000 mg | ORAL_TABLET | Freq: Every day | ORAL | 8 refills | Status: DC

## 2016-02-07 MED ORDER — SITAGLIPTIN PHOS-METFORMIN HCL 50-1000 MG PO TABS: 1.0000 | ORAL_TABLET | Freq: Two times a day (BID) | ORAL | 8 refills | Status: DC

## 2016-02-07 NOTE — Patient Instructions (Signed)
GO TO THE LAB : Get the blood work     GO TO THE FRONT DESK Schedule your next appointment for a  Check up in 6 months   Get a wrist splint for carpal tunnel

## 2016-02-07 NOTE — Progress Notes (Signed)
Pre visit review using our clinic review tool, if applicable. No additional management support is needed unless otherwise documented below in the visit note. 

## 2016-02-07 NOTE — Progress Notes (Signed)
   Subjective:    Patient ID: Shaun Vargas, male    DOB: 01/26/1966, 50 y.o.   MRN: [id number]  DOS:  02/07/2016 Type of visit - description : Routine checkup Interval history:  good medication compliance, CBGs ranged from 110-100 depending on level of activity and diet. Reports occasional right hand numbness, mostly at the fingers, could not tell if is more at the ulnar or radial side. No pain at the wrist. No neck pain, reports he uses his right hand frequently at work  Review of Systems Denies chest pain or difficulty breathing No nausea, vomiting, diarrhea  Past Medical History:  Diagnosis Date  . Anxiety   . Diabetes mellitus 05/22/07    Past Surgical History:  Procedure Laterality Date  . NO PAST SURGERIES      Social History   Social History  . Marital status: Married    Spouse name: N/A  . Number of children: 2  . Years of education: N/A   Occupational History  . 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 Smoking status: Never Smoker  . Smokeless tobacco: Never Used  . Alcohol use Yes     Comment: rarely  . Drug use: Unknown  . Sexual activity: Not on file   Other Topics Concern  . Not on file   Social History Narrative   From [place]   Lives w/ wife, wife's son and mother   His children are in [place]        Medication List       Accurate as of 02/07/16 11:59 PM. Always use your most recent med list.          aspirin 81 MG tablet Take 81 mg by mouth daily.   glimepiride 4 MG tablet Commonly known as:  AMARYL Take 2 tablets (8 mg total) by mouth daily with breakfast.   sitaGLIPtin-metformin 50-1000 MG tablet Commonly known as:  JANUMET Take 1 tablet by mouth 2 (two) times daily with a meal.          Objective:   Physical Exam BP 120/70 (BP Location: Left Arm, Patient Position: Sitting, Cuff Size: Normal)   Pulse 69   Temp 97.8 F (36.6 C) (Oral)   Resp 12   Ht 5\' 10"  (1.778 m)   Wt 208 lb 6 oz (94.5 kg)   SpO2 99%    BMI 29.90 kg/m  General:   Well developed, well nourished . NAD.  HEENT:  Normocephalic . Face symmetric, atraumatic Lungs:  CTA B Normal respiratory effort, no intercostal retractions, no accessory muscle use. Heart: RRR,  no murmur.  No pretibial edema bilaterally  Skin: Not pale. Not jaundice MSK: Inspection on palpation of elbows, wrists and hands normal Neurologic:  alert & oriented X3.  Speech normal, gait appropriate for age and unassisted. Motor strength symmetric. Tinel's negative at wrists and elbows. Psych--  Cognition and judgment appear intact.  Cooperative with normal attention span and concentration.  Behavior appropriate. No anxious or depressed appearing.      Assessment & Plan:   Assessment > DM 2008  Hyperlipidemia Anxiety   PLAN: DM: Continue glimepiride, Janumet, last A1c satisfactory, check labs and refill medications CTS R hand? sx mild, neuro exam negative, recommend CTS splint and let me know if not better. All instructions discuss in Spanish and he verbalized understanding Flu shot today RTC 6 months

## 2016-02-08 LAB — HEMOGLOBIN A1C: HEMOGLOBIN A1C: 6.3 % (ref 4.6–6.5)

## 2016-02-08 NOTE — Assessment & Plan Note (Signed)
DM: Continue glimepiride, Janumet, last A1c satisfactory, check labs and refill medications CTS R hand? sx mild, neuro exam negative, recommend CTS splint and let me know if not better. All instructions discuss in Spanish and he verbalized understanding Flu shot today RTC 6 months

## 2016-02-10 ENCOUNTER — Other Ambulatory Visit: Payer: Self-pay | Admitting: Internal Medicine

## 2016-04-28 LAB — HM DIABETES EYE EXAM

## 2016-05-01 ENCOUNTER — Encounter: Payer: Self-pay | Admitting: Internal Medicine

## 2016-05-01 NOTE — Progress Notes (Signed)
Abstracted Diabetic Eye exam 04/28/16 [facility]; No Retinopathy, F/U in [1] Year/SLS 12/04

## 2016-05-17 ENCOUNTER — Other Ambulatory Visit: Payer: Self-pay | Admitting: Internal Medicine

## 2016-08-07 ENCOUNTER — Ambulatory Visit: Payer: BLUE CROSS/BLUE SHIELD | Admitting: Internal Medicine

## 2016-08-09 ENCOUNTER — Ambulatory Visit (INDEPENDENT_AMBULATORY_CARE_PROVIDER_SITE_OTHER): Payer: BLUE CROSS/BLUE SHIELD | Admitting: Internal Medicine

## 2016-08-09 ENCOUNTER — Encounter: Payer: Self-pay | Admitting: Internal Medicine

## 2016-08-09 VITALS — BP 132/84 | HR 91 | Temp 98.1°F | Resp 14 | Ht 70.0 in | Wt 219.5 lb

## 2016-08-09 DIAGNOSIS — E119 Type 2 diabetes mellitus without complications: Secondary | ICD-10-CM | POA: Diagnosis not present

## 2016-08-09 NOTE — Progress Notes (Signed)
Pre visit review using our clinic review tool, if applicable. No additional management support is needed unless otherwise documented below in the visit note. 

## 2016-08-09 NOTE — Patient Instructions (Addendum)
GO TO THE LAB : Get the blood work     GO TO THE FRONT DESK Schedule your next appointment for a  Physical in 4-5 months, fasting

## 2016-08-09 NOTE — Progress Notes (Signed)
   Subjective:    Patient ID: Shaun Vargas, male    DOB: 06/30/1965, 51 y.o.   MRN: [id number]  DOS:  08/09/2016 Type of visit - description : rov Interval history: No major concerns. Good compliance w/ medication, ambulatory CBGs in the morning varies according to his diet  from the previous day.  Review of Systems Admits to poor diet in the last few months, he is now determined to go back to a more healthy way to eat. He remains active at work. No chest pain or difficulty breathing No visual disturbances  Past Medical History:  Diagnosis Date  . Anxiety   . Diabetes mellitus 05/22/07    Past Surgical History:  Procedure Laterality Date  . NO PAST SURGERIES      Social History   Social History  . Marital status: Married    Spouse name: N/A  . Number of children: 2  . Years of education: N/A   Occupational History  . 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 Smoking status: Never Smoker  . Smokeless tobacco: Never Used  . Alcohol use Yes     Comment: rarely  . Drug use: Unknown  . Sexual activity: Not on file   Other Topics Concern  . Not on file   Social History Narrative   From [place]   Lives w/ wife, wife's son and mother   His children are in [place]      Allergies as of 08/09/2016   No Known Allergies     Medication List       Accurate as of 08/09/16 11:59 PM. Always use your most recent med list.          aspirin 81 MG tablet Take 81 mg by mouth daily.   glimepiride 4 MG tablet Commonly known as:  AMARYL Take 2 tablets (8 mg total) by mouth daily with breakfast.   sitaGLIPtin-metformin 50-1000 MG tablet Commonly known as:  JANUMET Take 1 tablet by mouth 2 (two) times daily with a meal.          Objective:   Physical Exam BP 132/84 (BP Location: Left Arm, Patient Position: Sitting, Cuff Size: Normal)   Pulse 91   Temp 98.1 F (36.7 C) (Oral)   Resp 14   Ht 5\' 10"  (1.778 m)   Wt 219 lb 8 oz (99.6 kg)   SpO2 98%   BMI  31.49 kg/m  General:   Well developed, well nourished . NAD.  HEENT:  Normocephalic . Face symmetric, atraumatic Lungs:  CTA B Normal respiratory effort, no intercostal retractions, no accessory muscle use. Heart: RRR,  no murmur.  No pretibial edema bilaterally  Skin: Not pale. Not jaundice Neurologic:  alert & oriented X3.  Speech normal, gait appropriate for age and unassisted Psych--  Cognition and judgment appear intact.  Cooperative with normal attention span and concentration.  Behavior appropriate. No anxious or depressed appearing.      Assessment & Plan:   Assessment   DM 2008  Hyperlipidemia Anxiety   PLAN: DM: Last A1c 6.3, his diet has not been the best lately but is determined  to change. Will continue with Janumet, glimepiride. Check a BMP, AST, ALT, A1c.  RTC 4-5 months, CPX

## 2016-08-10 LAB — BASIC METABOLIC PANEL
BUN: 17 mg/dL (ref 6–23)
CO2: 32 mEq/L (ref 19–32)
CREATININE: 0.87 mg/dL (ref 0.40–1.50)
Calcium: 9.8 mg/dL (ref 8.4–10.5)
Chloride: 101 mEq/L (ref 96–112)
GFR: 98.65 mL/min (ref >60.00)
Glucose, Bld: 126 mg/dL — ABNORMAL HIGH (ref 70–99)
Potassium: 4.7 mEq/L (ref 3.5–5.1)
Sodium: 140 mEq/L (ref 135–145)

## 2016-08-10 LAB — ALT: ALT: 33 U/L (ref 0–53)

## 2016-08-10 LAB — AST: AST: 27 U/L (ref 0–37)

## 2016-08-10 LAB — HEMOGLOBIN A1C: Hgb A1c MFr Bld: 7 % — ABNORMAL HIGH (ref 4.6–6.5)

## 2016-08-10 NOTE — Assessment & Plan Note (Signed)
DM: Last A1c 6.3, his diet has not been the best lately but is determined  to change. Will continue with Janumet, glimepiride. Check a BMP, AST, ALT, A1c.  RTC 4-5 months, CPX

## 2016-09-22 IMAGING — DX DG FINGER MIDDLE 2+V*R*
3 series · 3 of 3 positions shown · non-contrast
Comparison: None.

CLINICAL DATA: Laceration to the right middle finger while Rudi
eating.

EXAM:
RIGHT MIDDLE FINGER 2+V

[finger ap]
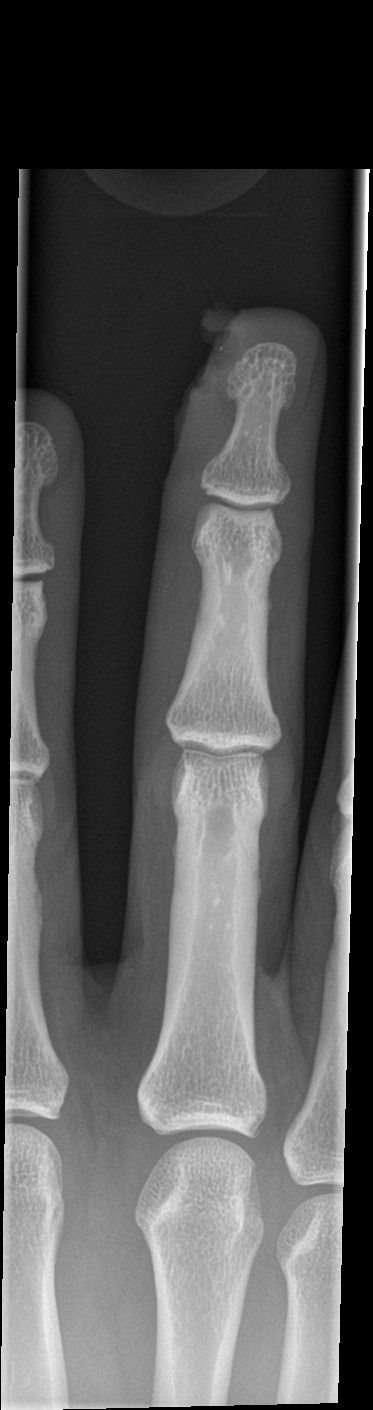

[finger obl]
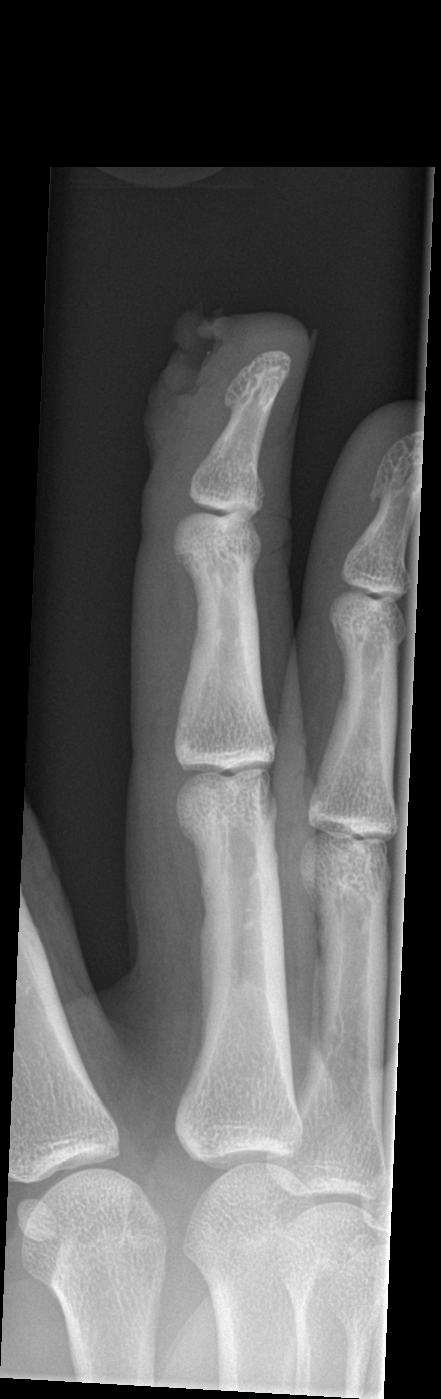

[finger lat]
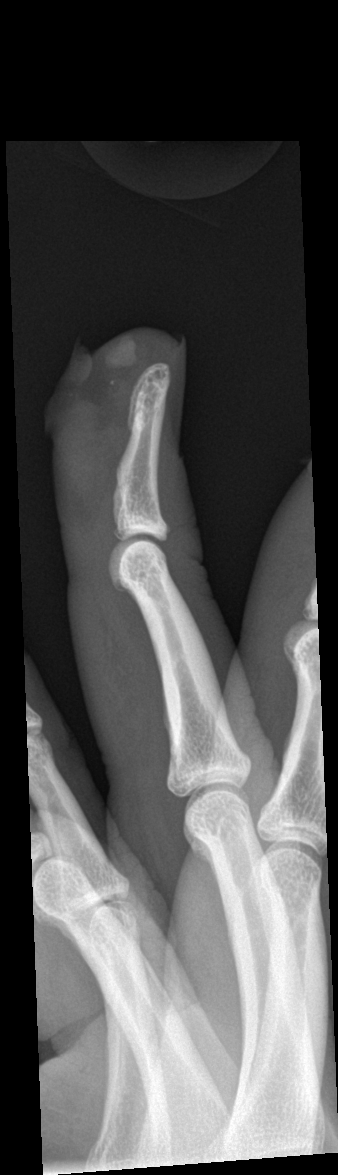

[3 of 3 positions shown; findings below may reference images not displayed]

FINDINGS: A complex laceration is evident within the distal ventral and radial
aspect of the third digit. There is a punctate radiopaque density
which could represent a small bone fragment or foreign body. No
definite fracture is evident. The joints are intact.
IMPRESSION: 1. Complex laceration of the distal middle finger without a definite
fracture.
2. Punctate density likely represents a small foreign body.

## 2016-11-27 ENCOUNTER — Other Ambulatory Visit: Payer: Self-pay | Admitting: Internal Medicine

## 2016-12-04 ENCOUNTER — Telehealth: Payer: Self-pay

## 2016-12-04 MED ORDER — METFORMIN HCL 1000 MG PO TABS: 1000.0000 mg | ORAL_TABLET | Freq: Two times a day (BID) | ORAL | 3 refills | Status: DC

## 2016-12-04 MED ORDER — PIOGLITAZONE HCL 30 MG PO TABS: 30.0000 mg | ORAL_TABLET | Freq: Every day | ORAL | 3 refills | Status: DC

## 2016-12-04 NOTE — Telephone Encounter (Signed)
LMOM informing Pt of med changes; instructed Pt to call to reschedule CPE in 3 months and to let us know if questions/concerns.

## 2016-12-04 NOTE — Telephone Encounter (Signed)
Received [facility] from [facility] regarding Janumet 50-1,000mg  tabs. Pt has lost insurance and can't afford medication. Requesting less expensive alternative.

## 2016-12-04 NOTE — Telephone Encounter (Signed)
Stop Janumet Call metformin 1000 mg one tablet twice a day #60 and 3 refills Call Actos 30 mg one tablet daily #30 and 3 refills. This is a new description, advise patient to call if he has side effects such as edema. Needs to be seen in 3 months. Please arrange

## 2017-01-03 ENCOUNTER — Encounter: Payer: Self-pay | Admitting: Internal Medicine

## 2017-01-03 ENCOUNTER — Ambulatory Visit (INDEPENDENT_AMBULATORY_CARE_PROVIDER_SITE_OTHER): Payer: Self-pay | Admitting: Internal Medicine

## 2017-01-03 VITALS — BP 128/74 | HR 84 | Temp 97.7°F | Resp 14 | Ht 70.0 in | Wt 209.4 lb

## 2017-01-03 DIAGNOSIS — Z91030 Bee allergy status: Secondary | ICD-10-CM

## 2017-01-03 DIAGNOSIS — Z0000 Encounter for general adult medical examination without abnormal findings: Secondary | ICD-10-CM

## 2017-01-03 DIAGNOSIS — E119 Type 2 diabetes mellitus without complications: Secondary | ICD-10-CM

## 2017-01-03 DIAGNOSIS — Z91038 Other insect allergy status: Secondary | ICD-10-CM

## 2017-01-03 LAB — MICROALBUMIN / CREATININE URINE RATIO
Creatinine,U: 175.2 mg/dL
MICROALB UR: 1.5 mg/dL (ref 0.0–1.9)
MICROALB/CREAT RATIO: 0.9 mg/g (ref 0.0–30.0)

## 2017-01-03 LAB — HEMOGLOBIN A1C: HEMOGLOBIN A1C: 7.3 % — AB (ref 4.6–6.5)

## 2017-01-03 MED ORDER — PIOGLITAZONE HCL 30 MG PO TABS: 30.0000 mg | ORAL_TABLET | Freq: Every day | ORAL | 3 refills | Status: DC

## 2017-01-03 MED ORDER — EPINEPHRINE 0.3 MG/0.3ML IJ SOAJ: 0.3000 mg | Freq: Once | INTRAMUSCULAR | 0 refills | Status: AC

## 2017-01-03 NOTE — Progress Notes (Signed)
Pre visit review using our clinic review tool, if applicable. No additional management support is needed unless otherwise documented below in the visit note. 

## 2017-01-03 NOTE — Progress Notes (Signed)
Subjective:    Patient ID: Shaun Vargas, male    DOB: 02/26/1966, 51 y.o.   MRN: [id number]  DOS:  01/03/2017 Type of visit - description : ROV Interval history: Lost insurance coverage. Janumet had to be stopped. Not taking Actos, cost? Not taking aspirin, he forgets. Reports that this summer was stung by  bumble bees, had a  local reaction but also generalized itching. No lip or tongue swelling. Felt dizzy one time?   Review of Systems   Past Medical History:  Diagnosis Date  . Anxiety   . Diabetes mellitus 05/22/07    Past Surgical History:  Procedure Laterality Date  . NO PAST SURGERIES      Social History   Social History  . Marital status: Married    Spouse name: N/A  . Number of children: 2  . Years of education: N/A   Occupational History  . 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 Smoking status: Never Smoker  . Smokeless tobacco: Never Used  . Alcohol use Yes     Comment: rarely  . Drug use: Unknown  . Sexual activity: Not on file   Other Topics Concern  . Not on file   Social History Narrative   From [place]   Lives w/ wife, wife's son and mother   His children are in [place]      Allergies as of 01/03/2017   No Known Allergies     Medication List       Accurate as of 01/03/17  9:19 AM. Always use your most recent med list.          aspirin 81 MG tablet Take 81 mg by mouth daily.   glimepiride 4 MG tablet Commonly known as:  AMARYL Take 2 tablets (8 mg total) by mouth daily with breakfast.   metFORMIN 1000 MG tablet Commonly known as:  GLUCOPHAGE Take 1 tablet (1,000 mg total) by mouth 2 (two) times daily with a meal.   pioglitazone 30 MG tablet Commonly known as:  ACTOS Take 1 tablet (30 mg total) by mouth daily.          Objective:   Physical Exam BP 128/74 (BP Location: Left Arm, Patient Position: Sitting, Cuff Size: Normal)   Pulse 84   Temp 97.7 F (36.5 C) (Oral)   Resp 14   Ht 5\' 10"  (1.778 m)   Wt  209 lb 6 oz (95 kg)   SpO2 98%   BMI 30.04 kg/m  General:   Well developed, well nourished . NAD.  HEENT:  Normocephalic . Face symmetric, atraumatic Lungs:  CTA B Normal respiratory effort, no intercostal retractions, no accessory muscle use. Heart: RRR,  no murmur.  No pretibial edema bilaterally  DIABETIC FEET EXAM: No lower extremity edema Normal pedal pulses bilaterally Pinprick examination of the feet normal. Neurologic:  alert & oriented X3.  Speech normal, gait appropriate for age and unassisted Psych--  Cognition and judgment appear intact.  Cooperative with normal attention span and concentration.  Behavior appropriate. No anxious or depressed appearing.      Assessment & Plan:   Assessment   DM 2008  Hyperlipidemia Anxiety  Allergic to bumble bees sting, epi-pen rx 12-2016  PLAN: DM: Lost insurance, Janumet had to be stopped. He continue with glimepiride, was started on metformin and Actos. Apparently not taking Actos due to cost?. Foot exam normal today. Plan: A1c, microalbumin. Recommend to take the 3 meds a  RX, new prescription for Actos printed. Recommend to go to different pharmacies to get a reasonable price. Allergy, bumble bees stings: I'm concerned about his symptoms, he does not only has a local reaction but also generalized itching. This could be very serious, risk of death discussed with the patient. Strongly recommend avoidance. If he is exposed, needs to take Benadryl immediately and watch for symptoms, if he has any systemic problems need to use a EpiPen and go to the ER. Discussed in spanish. The patient verbalized understanding.

## 2017-01-03 NOTE — Patient Instructions (Signed)
GO TO THE LAB : Get the blood work     GO TO THE FRONT DESK Schedule your next appointment for a  Check up in 3 months  

## 2017-01-04 DIAGNOSIS — Z91030 Bee allergy status: Secondary | ICD-10-CM | POA: Insufficient documentation

## 2017-01-04 NOTE — Assessment & Plan Note (Signed)
DM: Lost insurance, Janumet had to be stopped. He continue with glimepiride, was started on metformin and Actos. Apparently not taking Actos due to cost?. Foot exam normal today. Plan: A1c, microalbumin. Recommend to take the 3 meds a RX, new prescription for Actos printed. Recommend to go to different pharmacies to get a reasonable price. Allergy, bumble bees stings: I'm concerned about his symptoms, he does not only has a local reaction but also generalized itching. This could be very serious, risk of death discussed with the patient. Strongly recommend avoidance. If he is exposed, needs to take Benadryl immediately and watch for symptoms, if he has any systemic problems need to use a EpiPen and go to the ER. Discussed in spanish. The patient verbalized understanding.

## 2017-02-02 ENCOUNTER — Other Ambulatory Visit: Payer: Self-pay | Admitting: Internal Medicine

## 2017-02-06 ENCOUNTER — Telehealth: Payer: Self-pay | Admitting: Internal Medicine

## 2017-02-06 MED ORDER — METFORMIN HCL 1000 MG PO TABS: 1000.0000 mg | ORAL_TABLET | Freq: Two times a day (BID) | ORAL | 3 refills | Status: DC

## 2017-02-06 NOTE — Telephone Encounter (Signed)
Caller name: Byrd HesselbachMaria  Relation to pt: Spouse  Call back number: [phone] [id number] [place], [place] - [address]  Reason for call: Pt's wife states pt is needing refill on metFORMIN (GLUCOPHAGE) 1000 MG tablet. Please advise.

## 2017-02-06 NOTE — Telephone Encounter (Signed)
Rx sent 

## 2017-04-05 ENCOUNTER — Other Ambulatory Visit: Payer: Self-pay | Admitting: Internal Medicine

## 2017-04-06 ENCOUNTER — Encounter: Payer: Self-pay | Admitting: Internal Medicine

## 2017-04-06 ENCOUNTER — Ambulatory Visit (INDEPENDENT_AMBULATORY_CARE_PROVIDER_SITE_OTHER): Payer: Self-pay | Admitting: Internal Medicine

## 2017-04-06 VITALS — BP 124/68 | HR 65 | Temp 98.1°F | Resp 14 | Ht 70.0 in | Wt 214.2 lb

## 2017-04-06 DIAGNOSIS — E119 Type 2 diabetes mellitus without complications: Secondary | ICD-10-CM

## 2017-04-06 MED ORDER — GLIMEPIRIDE 4 MG PO TABS: 8.0000 mg | ORAL_TABLET | Freq: Every day | ORAL | 5 refills | Status: DC

## 2017-04-06 MED ORDER — METFORMIN HCL 1000 MG PO TABS: 1000.0000 mg | ORAL_TABLET | Freq: Two times a day (BID) | ORAL | 5 refills | Status: DC

## 2017-04-06 MED ORDER — PIOGLITAZONE HCL 30 MG PO TABS: 30.0000 mg | ORAL_TABLET | Freq: Every day | ORAL | 5 refills | Status: DC

## 2017-04-06 NOTE — Progress Notes (Signed)
Pre visit review using our clinic review tool, if applicable. No additional management support is needed unless otherwise documented below in the visit note. 

## 2017-04-06 NOTE — Progress Notes (Signed)
Subjective:    Patient ID: Shaun Vargas, male    DOB: 11/29/1965, 51 y.o.   MRN: [id number]  DOS:  04/06/2017 Type of visit - description : rov Interval history: Since the last visit, he is back working. 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 His job is more physical on and off. CBGs run from 90-150.  Review of Systems  Denies chest pain, difficulty breathing or lower extremity edema No symptoms consistent with low blood sugar.  Past Medical History:  Diagnosis Date  . Anxiety   . Diabetes mellitus 05/22/07    Past Surgical History:  Procedure Laterality Date  . NO PAST SURGERIES      Social History   Socioeconomic History  . Marital status: Married    Spouse name: Not on file  . Number of children: 2  . Years of education: Not on file  . Highest education level: Not on file  Social Needs  . Financial resource strain: Not on file  . Food insecurity - worry: Not on file  . Food insecurity - inability: Not on file  . Transportation needs - medical: Not on file  . Transportation needs - non-medical: Not on file  Occupational History  . Occupation: Works @ a [employer]   Tobacco Use  . Smoking status: Never Smoker  . Smokeless tobacco: Never Used  Substance and Sexual Activity  . Alcohol use: Yes    Comment: rarely  . Drug use: Not on file  . Sexual activity: Not on file  Other Topics Concern  . Not on file  Social History Narrative   From [place]   Lives w/ wife, wife's son and mother   His children are in [place]      Allergies as of 04/06/2017   No Known Allergies     Medication List        Accurate as of 04/06/17  4:19 PM. Always use your most recent med list.          aspirin 81 MG tablet Take 81 mg by mouth daily.   glimepiride 4 MG tablet Commonly known as:  AMARYL Take 2 tablets (8 mg total) by mouth daily with breakfast.   metFORMIN 1000 MG tablet Commonly known  as:  GLUCOPHAGE Take 1 tablet (1,000 mg total) 2 (two) times daily with a meal by mouth.   pioglitazone 30 MG tablet Commonly known as:  ACTOS Take 1 tablet (30 mg total) by mouth daily.          Objective:   Physical Exam BP 124/68 (BP Location: Left Arm, Patient Position: Sitting, Cuff Size: Normal)   Pulse 65   Temp 98.1 F (36.7 C) (Oral)   Resp 14   Ht 5\' 10"  (1.778 m)   Wt 214 lb 4 oz (97.2 kg)   SpO2 97%   BMI 30.74 kg/m  General:   Well developed, well nourished . NAD.  HEENT:  Normocephalic . Face symmetric, atraumatic Lungs:  CTA B Normal respiratory effort, no intercostal retractions, no accessory muscle use. Heart: RRR,  no murmur.  No pretibial edema bilaterally  Skin: Not pale. Not jaundice Neurologic:  alert & oriented X3.  Speech normal, gait appropriate for age and unassisted Psych--  Cognition and judgment appear intact.  Cooperative with normal attention span and concentration.  Behavior appropriate. No anxious or depressed appearing.  Assessment & Plan:   Assessment   DM 2008  Hyperlipidemia Anxiety  Allergic to bumble bees sting, epi-pen rx 12-2016  PLAN: DM: Since the last office visit he is compliant with glimepiride, metformin and Actos.  His range from 95-150.  Will check a CMP, A1c.  Continue same medications.  Watch for low sugar symptoms. Flu shot?  Declined it due to no insurance but will look into doing that at the pharmacy RTC 4 months

## 2017-04-06 NOTE — Patient Instructions (Signed)
GO TO THE LAB : Get the blood work     GO TO THE FRONT DESK Schedule your next appointment for a follow-up in 4-5 months

## 2017-04-07 LAB — COMPREHENSIVE METABOLIC PANEL
AG Ratio: 1.4 (calc) (ref 1.0–2.5)
ALBUMIN MSPROF: 4.5 g/dL (ref 3.6–5.1)
ALKALINE PHOSPHATASE (APISO): 62 U/L (ref 40–115)
ALT: 30 U/L (ref 9–46)
AST: 39 U/L — AB (ref 10–35)
BILIRUBIN TOTAL: 0.7 mg/dL (ref 0.2–1.2)
BUN: 19 mg/dL (ref 7–25)
CALCIUM: 9.5 mg/dL (ref 8.6–10.3)
CHLORIDE: 102 mmol/L (ref 98–110)
CO2: 28 mmol/L (ref 20–32)
Creat: 0.85 mg/dL (ref 0.70–1.33)
Globulin: 3.2 g/dL (calc) (ref 1.9–3.7)
Glucose, Bld: 114 mg/dL — ABNORMAL HIGH (ref 65–99)
POTASSIUM: 4.1 mmol/L (ref 3.5–5.3)
Sodium: 138 mmol/L (ref 135–146)
Total Protein: 7.7 g/dL (ref 6.1–8.1)

## 2017-04-07 LAB — HEMOGLOBIN A1C
EAG (MMOL/L): 7.6 (calc)
HEMOGLOBIN A1C: 6.4 %{Hb} — AB (ref <5.7)
MEAN PLASMA GLUCOSE: 137 (calc)

## 2017-04-08 NOTE — Assessment & Plan Note (Signed)
DM: Since the last office visit he is compliant with glimepiride, metformin and Actos.  His range from 95-150.  Will check a CMP, A1c.  Continue same medications.  Watch for low sugar symptoms. Flu shot?  Declined it due to no insurance but will look into doing that at the pharmacy RTC 4 months

## 2017-05-03 ENCOUNTER — Other Ambulatory Visit: Payer: Self-pay | Admitting: Internal Medicine

## 2017-05-04 LAB — HM DIABETES EYE EXAM

## 2017-05-11 ENCOUNTER — Encounter: Payer: Self-pay | Admitting: Internal Medicine

## 2017-06-02 ENCOUNTER — Other Ambulatory Visit: Payer: Self-pay | Admitting: Internal Medicine

## 2017-06-04 ENCOUNTER — Other Ambulatory Visit: Payer: Self-pay | Admitting: Internal Medicine

## 2017-08-03 ENCOUNTER — Ambulatory Visit (INDEPENDENT_AMBULATORY_CARE_PROVIDER_SITE_OTHER): Payer: Self-pay | Admitting: Internal Medicine

## 2017-08-03 ENCOUNTER — Encounter: Payer: Self-pay | Admitting: Internal Medicine

## 2017-08-03 VITALS — BP 122/70 | HR 70 | Temp 98.0°F | Resp 14 | Ht 70.0 in | Wt 227.1 lb

## 2017-08-03 DIAGNOSIS — R635 Abnormal weight gain: Secondary | ICD-10-CM

## 2017-08-03 DIAGNOSIS — E119 Type 2 diabetes mellitus without complications: Secondary | ICD-10-CM

## 2017-08-03 MED ORDER — PIOGLITAZONE HCL 30 MG PO TABS: 30.0000 mg | ORAL_TABLET | Freq: Every day | ORAL | 5 refills | Status: DC

## 2017-08-03 NOTE — Progress Notes (Signed)
Pre visit review using our clinic review tool, if applicable. No additional management support is needed unless otherwise documented below in the visit note. 

## 2017-08-03 NOTE — Patient Instructions (Signed)
GO TO THE LAB : Get the blood work     GO TO THE FRONT DESK Schedule your next appointment for a  checkup in 3 months  

## 2017-08-03 NOTE — Progress Notes (Signed)
Subjective:    Patient ID: [name], male    DOB: 08/24/1965, 52 y.o.   MRN: [id number]  DOS:  08/03/2017 Type of visit - description : Follow-up Interval history: Routine office visit. Good compliance with medication I noticed significant weight gain, patient reports that actually for the last 8 months his diet has not been the best: for almost 3 [place] was [place] of [place], had to ate in restaurants, pizza etc. almost every night.  Wt Readings from Last 3 Encounters:  08/03/17 227 lb 2 oz (103 kg)  04/06/17 214 lb 4 oz (97.2 kg)  01/03/17 209 lb 6 oz (95 kg)     Review of Systems Denies chest pain, difficulty breathing, no lower extremity edema. No nausea, vomiting, diarrhea  Past Medical History:  Diagnosis Date  . Anxiety   . Diabetes mellitus 05/22/07    Past Surgical History:  Procedure Laterality Date  . NO PAST SURGERIES      Social History   Socioeconomic History  . Marital status: Married    Spouse name: Not on file  . Number of children: 2  . Years of education: Not on file  . Highest education level: Not on file  Social Needs  . Financial resource strain: Not on file  . Food insecurity - worry: Not on file  . Food insecurity - inability: Not on file  . Transportation needs - medical: Not on file  . Transportation needs - non-medical: Not on file  Occupational History  . Occupation: Works @ a [employer]   Tobacco Use  . Smoking status: Never Smoker  . Smokeless tobacco: Never Used  Substance and Sexual Activity  . Alcohol use: Yes    Comment: rarely  . Drug use: Not on file  . Sexual activity: Not on file  Other Topics Concern  . Not on file  Social History Narrative   From [place]   Lives w/ wife, wife's son and mother   His children are in [place]      Allergies as of 08/03/2017      Reactions   Bee Venom Other (See Comments)   Reaction unknown      Medication List        Accurate as of 08/03/17 11:59 PM. Always use your most  recent med list.          aspirin 81 MG tablet Take 81 mg by mouth daily.   glimepiride 4 MG tablet Commonly known as:  AMARYL Take 2 tablets (8 mg total) by mouth daily with breakfast.   metFORMIN 1000 MG tablet Commonly known as:  GLUCOPHAGE Take 1 tablet (1,000 mg total) by mouth 2 (two) times daily with a meal.   pioglitazone 30 MG tablet Commonly known as:  ACTOS Take 1 tablet (30 mg total) by mouth daily.          Objective:   Physical Exam BP 122/70 (BP Location: Left Arm, Patient Position: Sitting, Cuff Size: Normal)   Pulse 70   Temp 98 F (36.7 C) (Oral)   Resp 14   Ht 5\' 10"  (1.778 m)   Wt 227 lb 2 oz (103 kg)   SpO2 98%   BMI 32.59 kg/m   General:   Well developed, well nourished . NAD.  HEENT:  Normocephalic . Face symmetric, atraumatic Neck: No JVD at 45 degrees Lungs:  CTA B Normal respiratory effort, no intercostal retractions, no accessory muscle use. Heart: RRR,  no murmur.  No pretibial  edema bilaterally  Skin: Not pale. Not jaundice Neurologic:  alert & oriented X3.  Speech normal, gait appropriate for age and unassisted Psych--  Cognition and judgment appear intact.  Cooperative with normal attention span and concentration.  Behavior appropriate. No anxious or depressed appearing.      Assessment & Plan:   Assessment   DM 2008  Hyperlipidemia Anxiety  Allergic to bumble bees sting, epi-pen rx 12-2016  PLAN: DM: Good compliance with glimepiride, metformin and Actos.  Check a CMP, A1c. Weight gain: Significant weight gain, patient reports the trend started several months ago, admits to very poor diet lately.  I also noted that he started Actos 11-2016, that may have trigger weight gain ; currently w/o evidence of volume overload.  Will check a TSH,  consider switch Actos to another agent.  Cost is an issue for the patient. Try a branded med w/ a coupon?  Good Rx?  RTC 3 months

## 2017-08-04 LAB — COMPREHENSIVE METABOLIC PANEL
AG RATIO: 1.5 (calc) (ref 1.0–2.5)
ALBUMIN MSPROF: 4.1 g/dL (ref 3.6–5.1)
ALT: 21 U/L (ref 9–46)
AST: 23 U/L (ref 10–35)
Alkaline phosphatase (APISO): 66 U/L (ref 40–115)
BUN: 19 mg/dL (ref 7–25)
CHLORIDE: 105 mmol/L (ref 98–110)
CO2: 26 mmol/L (ref 20–32)
CREATININE: 0.83 mg/dL (ref 0.70–1.33)
Calcium: 9.2 mg/dL (ref 8.6–10.3)
GLOBULIN: 2.8 g/dL (ref 1.9–3.7)
GLUCOSE: 110 mg/dL — AB (ref 65–99)
POTASSIUM: 3.9 mmol/L (ref 3.5–5.3)
SODIUM: 140 mmol/L (ref 135–146)
TOTAL PROTEIN: 6.9 g/dL (ref 6.1–8.1)
Total Bilirubin: 0.5 mg/dL (ref 0.2–1.2)

## 2017-08-04 LAB — HEMOGLOBIN A1C
EAG (MMOL/L): 7.6 (calc)
Hgb A1c MFr Bld: 6.4 % of total Hgb — ABNORMAL HIGH (ref <5.7)
Mean Plasma Glucose: 137 (calc)

## 2017-08-04 LAB — TSH: TSH: 1.32 mIU/L (ref 0.40–4.50)

## 2017-08-04 NOTE — Assessment & Plan Note (Signed)
DM: Good compliance with glimepiride, metformin and Actos.  Check a CMP, A1c. Weight gain: Significant weight gain, patient reports the trend started several months ago, admits to very poor diet lately.  I also noted that he started Actos 11-2016, that may have trigger weight gain ; currently w/o evidence of volume overload.  Will check a TSH,  consider switch Actos to another agent.  Cost is an issue for the patient. Try a branded med w/ a coupon?  Good Rx?  RTC 3 months

## 2017-10-07 ENCOUNTER — Other Ambulatory Visit: Payer: Self-pay | Admitting: Internal Medicine

## 2017-11-09 ENCOUNTER — Encounter: Payer: Self-pay | Admitting: Internal Medicine

## 2017-11-09 ENCOUNTER — Ambulatory Visit (INDEPENDENT_AMBULATORY_CARE_PROVIDER_SITE_OTHER): Payer: Self-pay | Admitting: Internal Medicine

## 2017-11-09 VITALS — BP 138/70 | HR 87 | Temp 97.6°F | Resp 16 | Ht 70.0 in | Wt 224.0 lb

## 2017-11-09 DIAGNOSIS — R635 Abnormal weight gain: Secondary | ICD-10-CM

## 2017-11-09 DIAGNOSIS — E785 Hyperlipidemia, unspecified: Secondary | ICD-10-CM

## 2017-11-09 DIAGNOSIS — E119 Type 2 diabetes mellitus without complications: Secondary | ICD-10-CM

## 2017-11-09 MED ORDER — PIOGLITAZONE HCL 30 MG PO TABS: 30.0000 mg | ORAL_TABLET | Freq: Every day | ORAL | 1 refills | Status: DC

## 2017-11-09 NOTE — Progress Notes (Signed)
Subjective:    Patient ID: Shaun Vargas, male    DOB: 1965/10/06, 52 y.o.   MRN: [id number]  DOS:  11/09/2017 Type of visit - description : Follow-up Interval history: Weight gain: Doing better with diet, has lost few pounds. DM: Good med compliance, ambulatory ABGs in the morning range from 1 20-1 30.  Very rarely 150 usually related to dietary indiscretion.  Wt Readings from Last 3 Encounters:  11/09/17 224 lb (101.6 kg)  08/03/17 227 lb 2 oz (103 kg)  04/06/17 214 lb 4 oz (97.2 kg)     Review of Systems Denies chest pain, difficulty breathing. No paresthesias No anxiety He remains active at work  Past Medical History:  Diagnosis Date  . Anxiety   . Diabetes mellitus 05/22/07    Past Surgical History:  Procedure Laterality Date  . NO PAST SURGERIES      Social History   Socioeconomic History  . Marital status: Married    Spouse name: Not on file  . Number of children: 2  . Years of education: Not on file  . Highest education level: Not on file  Occupational History  . 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 Financial resource strain: Not on file  . Food insecurity:    Worry: Not on file    Inability: Not on file  . Transportation needs:    Medical: Not on file    Non-medical: Not on file  Tobacco Use  . Smoking status: Never Smoker  . Smokeless tobacco: Never Used  Substance and Sexual Activity  . Alcohol use: Yes    Comment: rarely  . Drug use: Not on file  . Sexual activity: Not on file  Lifestyle  . Physical activity:    Days per week: Not on file    Minutes per session: Not on file  . Stress: Not on file  Relationships  . Social connections:    Talks on phone: Not on file    Gets together: Not on file    Attends religious service: Not on file    Active member of club or organization: Not on file    Attends meetings of clubs or organizations: Not on file    Relationship status: Not on file  . Intimate partner violence:    Fear  of current or ex partner: Not on file    Emotionally abused: Not on file    Physically abused: Not on file    Forced sexual activity: Not on file  Other Topics Concern  . Not on file  Social History Narrative   From [place]   Lives w/ wife, wife's son and mother   His children are in [place]      Allergies as of 11/09/2017      Reactions   Bee Venom Other (See Comments)   Reaction unknown      Medication List        Accurate as of 11/09/17 11:59 PM. Always use your most recent med list.          aspirin 81 MG tablet Take 81 mg by mouth daily.   glimepiride 4 MG tablet Commonly known as:  AMARYL Take 2 tablets (8 mg total) by mouth daily with breakfast.   metFORMIN 1000 MG tablet Commonly known as:  GLUCOPHAGE Take 1 tablet (1,000 mg total) by mouth 2 (two) times daily with a meal.   pioglitazone 30 MG tablet Commonly known as:  ACTOS Take 1  tablet (30 mg total) by mouth daily.          Objective:   Physical Exam BP 138/70 (BP Location: Left Arm, Patient Position: Sitting, Cuff Size: Normal)   Pulse 87   Temp 97.6 F (36.4 C) (Oral)   Resp 16   Ht 5\' 10"  (1.778 m)   Wt 224 lb (101.6 kg)   SpO2 96%   BMI 32.14 kg/m  General:   Well developed, NAD, see BMI.  HEENT:  Normocephalic . Face symmetric, atraumatic Lungs:  CTA B Normal respiratory effort, no intercostal retractions, no accessory muscle use. Heart: RRR,  no murmur.  No pretibial edema bilaterally  Skin: Not pale. Not jaundice Neurologic:  alert & oriented X3.  Speech normal, gait appropriate for age and unassisted Psych--  Cognition and judgment appear intact.  Cooperative with normal attention span and concentration.  Behavior appropriate. No anxious or depressed appearing.      Assessment & Plan:   Assessment   DM 2008  Hyperlipidemia Anxiety  Allergic to bumble bees sting, epi-pen rx 12-2016  PLAN: DM: Continue forming, Actos and glimepiride.  Refill Actos.  Declining labs due to  cost, no insurance, hopefully will get insurance next year.  Last A1c satisfactory.  Discussed diet again. Hyperlipidemia: Diet controlled, declined lab due to cost. Weight gain: Improved, has lost 3 pounds, doing better with diet, praised RTC 5 months.

## 2017-11-09 NOTE — Progress Notes (Signed)
Pre visit review using our clinic review tool, if applicable. No additional management support is needed unless otherwise documented below in the visit note. 

## 2017-11-09 NOTE — Patient Instructions (Signed)
GO TO THE FRONT DESK Schedule your next appointment for a  Check up, fasting, in 5 months

## 2017-11-10 NOTE — Assessment & Plan Note (Signed)
DM: Continue forming, Actos and glimepiride.  Refill Actos.  Declining labs due to cost, no insurance, hopefully will get insurance next year.  Last A1c satisfactory.  Discussed diet again. Hyperlipidemia: Diet controlled, declined lab due to cost. Weight gain: Improved, has lost 3 pounds, doing better with diet, praised RTC 5 months.

## 2018-04-09 ENCOUNTER — Other Ambulatory Visit: Payer: Self-pay | Admitting: Internal Medicine

## 2018-04-12 ENCOUNTER — Encounter: Payer: Self-pay | Admitting: Internal Medicine

## 2018-04-12 ENCOUNTER — Ambulatory Visit (INDEPENDENT_AMBULATORY_CARE_PROVIDER_SITE_OTHER): Payer: Self-pay | Admitting: Internal Medicine

## 2018-04-12 VITALS — BP 138/86 | HR 81 | Temp 98.1°F | Resp 16 | Ht 70.0 in | Wt 223.4 lb

## 2018-04-12 DIAGNOSIS — E119 Type 2 diabetes mellitus without complications: Secondary | ICD-10-CM

## 2018-04-12 DIAGNOSIS — E785 Hyperlipidemia, unspecified: Secondary | ICD-10-CM

## 2018-04-12 LAB — LIPID PANEL
CHOL/HDL RATIO: 3
CHOLESTEROL: 176 mg/dL (ref 0–200)
HDL: 68.4 mg/dL (ref >39.00)
LDL CALC: 97 mg/dL (ref 0–99)
NONHDL: 107.56
Triglycerides: 55 mg/dL (ref 0.0–149.0)
VLDL: 11 mg/dL (ref 0.0–40.0)

## 2018-04-12 LAB — HEMOGLOBIN A1C: HEMOGLOBIN A1C: 6.4 % (ref 4.6–6.5)

## 2018-04-12 LAB — MICROALBUMIN / CREATININE URINE RATIO
Creatinine,U: 122.1 mg/dL
MICROALB UR: 0.7 mg/dL (ref 0.0–1.9)
Microalb Creat Ratio: 0.6 mg/g (ref 0.0–30.0)

## 2018-04-12 MED ORDER — PIOGLITAZONE HCL 30 MG PO TABS: 30.0000 mg | ORAL_TABLET | Freq: Every day | ORAL | 1 refills | Status: DC

## 2018-04-12 NOTE — Patient Instructions (Signed)
GO TO THE LAB : Get the blood work     GO TO THE FRONT DESK Schedule your next appointment for a  Check up in 4-6 months   REGRESE EN 4 A 6 MESES PARA UN CHEQUEO

## 2018-04-12 NOTE — Progress Notes (Signed)
Subjective:    Patient ID: Shaun Vargas, male    DOB: 10/04/1965, 52 y.o.   MRN: [id number]  DOS:  04/12/2018 Type of visit - description : rov Interval history: Since the last office visit he is doing well, good compliance with medications. Ambulatory CBGs varies depending on diet from 110-130  Wt Readings from Last 3 Encounters:  04/12/18 223 lb 6 oz (101.3 kg)  11/09/17 224 lb (101.6 kg)  08/03/17 227 lb 2 oz (103 kg)     Review of Systems Denies lower extremity paresthesias Having some pain at the left heel, at the lateral sides.  No injury or swelling  Past Medical History:  Diagnosis Date  . Anxiety   . Diabetes mellitus 05/22/07    Past Surgical History:  Procedure Laterality Date  . NO PAST SURGERIES      Social History   Socioeconomic History  . Marital status: Married    Spouse name: Not on file  . Number of children: 2  . Years of education: Not on file  . Highest education level: Not on file  Occupational History  . 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 Financial resource strain: Not on file  . Food insecurity:    Worry: Not on file    Inability: Not on file  . Transportation needs:    Medical: Not on file    Non-medical: Not on file  Tobacco Use  . Smoking status: Never Smoker  . Smokeless tobacco: Never Used  Substance and Sexual Activity  . Alcohol use: Yes    Comment: rarely  . Drug use: Not on file  . Sexual activity: Not on file  Lifestyle  . Physical activity:    Days per week: Not on file    Minutes per session: Not on file  . Stress: Not on file  Relationships  . Social connections:    Talks on phone: Not on file    Gets together: Not on file    Attends religious service: Not on file    Active member of club or organization: Not on file    Attends meetings of clubs or organizations: Not on file    Relationship status: Not on file  . Intimate partner violence:    Fear of current or ex partner: Not on file   Emotionally abused: Not on file    Physically abused: Not on file    Forced sexual activity: Not on file  Other Topics Concern  . Not on file  Social History Narrative   From [place]   Lives w/ wife, wife's son and mother   His children are in [place]      Allergies as of 04/12/2018      Reactions   Bee Venom Other (See Comments)   Reaction unknown      Medication List        Accurate as of 04/12/18 11:59 PM. Always use your most recent med list.          aspirin 81 MG tablet Take 81 mg by mouth daily.   glimepiride 4 MG tablet Commonly known as:  AMARYL Take 2 tablets (8 mg total) by mouth daily with breakfast.   metFORMIN 1000 MG tablet Commonly known as:  GLUCOPHAGE Take 1 tablet (1,000 mg total) by mouth 2 (two) times daily with a meal.   pioglitazone 30 MG tablet Commonly known as:  ACTOS Take 1 tablet (30 mg total) by mouth daily.  Objective:   Physical Exam BP 138/86 (BP Location: Left Arm, Patient Position: Sitting, Cuff Size: Normal)   Pulse 81   Temp 98.1 F (36.7 C) (Oral)   Resp 16   Ht 5\' 10"  (1.778 m)   Wt 223 lb 6 oz (101.3 kg)   SpO2 95%   BMI 32.05 kg/m  General:   Well developed, NAD, BMI noted. HEENT:  Normocephalic . Face symmetric, atraumatic Lungs:  CTA B Normal respiratory effort, no intercostal retractions, no accessory muscle use. Heart: RRR,  no murmur.  DIABETIC FEET EXAM: No lower extremity edema Skin normal; few  calluses Pinprick examination of the feet normal. slt TTP at the sides of L heel Skin: Not pale. Not jaundice Neurologic:  alert & oriented X3.  Speech normal, gait appropriate for age and unassisted Psych--  Cognition and judgment appear intact.  Cooperative with normal attention span and concentration.  Behavior appropriate. No anxious or depressed appearing.      Assessment & Plan:   Assessment   DM 2008  Hyperlipidemia Anxiety  Allergic to bumble bees sting, epi-pen rx  12-2016  PLAN: DM: He remains active, continue Amaryl/Glucophage/Actos.  Check a A1c, micro.  Feet exam negative.  Last BMP satisfactory. Hyperlipidemia: Diet controlled, check a FLP.  He will be willing to take an additional medication if needed. Preventive care: Declined a flu shot mostly due to cost.  No insurance at this point but will get it soon. Heel pain, left: No classic for plantar fasciitis, symptoms are mild, recommend a heel shoe insert for now.  Call if not better RTC 4 to 6 months

## 2018-04-12 NOTE — Progress Notes (Signed)
Pre visit review using our clinic review tool, if applicable. No additional management support is needed unless otherwise documented below in the visit note. 

## 2018-04-14 NOTE — Assessment & Plan Note (Signed)
DM: He remains active, continue Amaryl/Glucophage/Actos.  Check a A1c, micro.  Feet exam negative.  Last BMP satisfactory. Hyperlipidemia: Diet controlled, check a FLP.  He will be willing to take an additional medication if needed. Preventive care: Declined a flu shot mostly due to cost.  No insurance at this point but will get it soon. Heel pain, left: No classic for plantar fasciitis, symptoms are mild, recommend a heel shoe insert for now.  Call if not better RTC 4 to 6 months

## 2018-05-10 LAB — HM DIABETES EYE EXAM

## 2018-05-16 ENCOUNTER — Encounter: Payer: Self-pay | Admitting: Internal Medicine

## 2018-07-15 ENCOUNTER — Other Ambulatory Visit: Payer: Self-pay | Admitting: Internal Medicine

## 2018-09-05 ENCOUNTER — Telehealth: Payer: Self-pay | Admitting: Internal Medicine

## 2018-09-05 NOTE — Telephone Encounter (Signed)
LVM for pt to be informed that his appt for 09-13-2018 is needed to be done as VOV since we are not seeing pt in office, if pt ok to set up as a VOV and pt can stay with appt day and time.

## 2018-09-13 ENCOUNTER — Other Ambulatory Visit: Payer: Self-pay

## 2018-09-13 ENCOUNTER — Ambulatory Visit (INDEPENDENT_AMBULATORY_CARE_PROVIDER_SITE_OTHER): Payer: BLUE CROSS/BLUE SHIELD | Admitting: Internal Medicine

## 2018-09-13 DIAGNOSIS — E119 Type 2 diabetes mellitus without complications: Secondary | ICD-10-CM

## 2018-09-13 NOTE — Progress Notes (Signed)
Subjective:    Patient ID: Shaun Vargas, male    DOB: 08/25/1965, 53 y.o.   MRN: [id number]  DOS:  09/13/2018 Type of visit - description: Virtual Visit via Video Note  I connected with@ on 09/13/18 at  4:00 PM EDT by a video enabled telemedicine application and verified that I am speaking with the correct person using two identifiers.   THIS ENCOUNTER IS A VIRTUAL VISIT DUE TO COVID-19 - PATIENT WAS NOT SEEN IN THE OFFICE. PATIENT HAS CONSENTED TO VIRTUAL VISIT / TELEMEDICINE VISIT   Location of patient: home  Location of provider: office  I discussed the limitations of evaluation and management by telemedicine and the availability of in person appointments. The patient expressed understanding and agreed to proceed.  History of Present Illness: Routine visit In general the patient is doing well. Coronavirus: Still working, he works outdoors, he is trying to follow all the precautions. We went over his medications, good compliance except for aspirin, he keeps forgetting and eventually self stopped. Ambulatory CBGs 110, 115. Labs reviewed    Review of Systems Denies fever chills No chest pain, difficulty breathing. No cough or sputum production  Past Medical History:  Diagnosis Date  . Anxiety   . Diabetes mellitus 05/22/07    Past Surgical History:  Procedure Laterality Date  . NO PAST SURGERIES      Social History   Socioeconomic History  . Marital status: Married    Spouse name: Not on file  . Number of children: 2  . Years of education: Not on file  . Highest education level: Not on file  Occupational History  . 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 Financial resource strain: Not on file  . Food insecurity:    Worry: Not on file    Inability: Not on file  . Transportation needs:    Medical: Not on file    Non-medical: Not on file  Tobacco Use  . Smoking status: Never Smoker  . Smokeless tobacco: Never Used  Substance and Sexual Activity   . Alcohol use: Yes    Comment: rarely  . Drug use: Not on file  . Sexual activity: Not on file  Lifestyle  . Physical activity:    Days per week: Not on file    Minutes per session: Not on file  . Stress: Not on file  Relationships  . Social connections:    Talks on phone: Not on file    Gets together: Not on file    Attends religious service: Not on file    Active member of club or organization: Not on file    Attends meetings of clubs or organizations: Not on file    Relationship status: Not on file  . Intimate partner violence:    Fear of current or ex partner: Not on file    Emotionally abused: Not on file    Physically abused: Not on file    Forced sexual activity: Not on file  Other Topics Concern  . Not on file  Social History Narrative   From [place]   Lives w/ wife, wife's son and mother   His children are in [place]      Allergies as of 09/13/2018      Reactions   Bee Venom Other (See Comments)   Reaction unknown      Medication List       Accurate as of September 13, 2018  4:14 PM. Always use  your most recent med list.        glimepiride 4 MG tablet Commonly known as:  AMARYL Take 2 tablets (8 mg total) by mouth daily with breakfast.   metFORMIN 1000 MG tablet Commonly known as:  GLUCOPHAGE Take 1 tablet (1,000 mg total) by mouth 2 (two) times daily with a meal.   pioglitazone 30 MG tablet Commonly known as:  Actos Take 1 tablet (30 mg total) by mouth daily.           Objective:   Physical Exam There were no vitals taken for this visit. This is a video visit, alert oriented x3, in no apparent distress    Assessment     Assessment   DM 2008  Hyperlipidemia Anxiety  Allergic to bumble bees sting, epi-pen rx 12-2016  PLAN:  DM: Currently on glimepiride, metformin and Actos.  Last A1c and microalbumin satisfactory. Stopped aspirin, will discuss at the next opportunity Plan: CMP, A1c to be done next Friday for us, 09/20/2018 at 3 PM When he  comes for blood work will set up an appointment for CPX in 6 months.    I discussed the assessment and treatment plan with the patient. The patient was provided an opportunity to ask questions and all were answered. The patient agreed with the plan and demonstrated an understanding of the instructions.   The patient was advised to call back or seek an in-person evaluation if the symptoms worsen or if the condition fails to improve as anticipated.

## 2018-09-14 NOTE — Assessment & Plan Note (Signed)
DM: Currently on glimepiride, metformin and Actos.  Last A1c and microalbumin satisfactory. Stopped aspirin, will discuss at the next opportunity Plan: CMP, A1c to be done next Friday for [place], 09/20/2018 at 3 PM When he comes for blood work will set up an appointment for CPX in 6 months.

## 2018-09-16 NOTE — Addendum Note (Signed)
Addended byConrad [place] D on: 09/16/2018 07:51 AM   Modules accepted: Orders

## 2018-09-20 ENCOUNTER — Other Ambulatory Visit (INDEPENDENT_AMBULATORY_CARE_PROVIDER_SITE_OTHER): Payer: BLUE CROSS/BLUE SHIELD

## 2018-09-20 ENCOUNTER — Other Ambulatory Visit: Payer: Self-pay

## 2018-09-20 DIAGNOSIS — E119 Type 2 diabetes mellitus without complications: Secondary | ICD-10-CM | POA: Diagnosis not present

## 2018-09-20 NOTE — Addendum Note (Signed)
Addended by: Natahlia Hoggard M on: 09/20/2018 02:54 PM   Modules accepted: Orders  

## 2018-09-20 NOTE — Addendum Note (Signed)
Addended by: Harley Alto on: 09/20/2018 02:54 PM   Modules accepted: Orders

## 2018-09-21 LAB — COMPREHENSIVE METABOLIC PANEL
AG Ratio: 1.4 (calc) (ref 1.0–2.5)
ALT: 21 U/L (ref 9–46)
AST: 22 U/L (ref 10–35)
Albumin: 4.6 g/dL (ref 3.6–5.1)
Alkaline phosphatase (APISO): 67 U/L (ref 35–144)
BUN: 16 mg/dL (ref 7–25)
CO2: 30 mmol/L (ref 20–32)
Calcium: 10 mg/dL (ref 8.6–10.3)
Chloride: 101 mmol/L (ref 98–110)
Creat: 1 mg/dL (ref 0.70–1.33)
Globulin: 3.3 g/dL (calc) (ref 1.9–3.7)
Glucose, Bld: 134 mg/dL — ABNORMAL HIGH (ref 65–99)
Potassium: 4.8 mmol/L (ref 3.5–5.3)
Sodium: 138 mmol/L (ref 135–146)
Total Bilirubin: 0.7 mg/dL (ref 0.2–1.2)
Total Protein: 7.9 g/dL (ref 6.1–8.1)

## 2018-09-21 LAB — HEMOGLOBIN A1C
Hgb A1c MFr Bld: 6.3 % of total Hgb — ABNORMAL HIGH (ref <5.7)
Mean Plasma Glucose: 134 (calc)
eAG (mmol/L): 7.4 (calc)

## 2018-10-17 ENCOUNTER — Other Ambulatory Visit: Payer: Self-pay | Admitting: Internal Medicine

## 2018-10-17 MED ORDER — PIOGLITAZONE HCL 30 MG PO TABS: 30.0000 mg | ORAL_TABLET | Freq: Every day | ORAL | 1 refills | Status: DC

## 2018-10-17 MED ORDER — GLIMEPIRIDE 4 MG PO TABS: 8.0000 mg | ORAL_TABLET | Freq: Every day | ORAL | 1 refills | Status: DC

## 2018-10-17 MED ORDER — METFORMIN HCL 1000 MG PO TABS: 1000.0000 mg | ORAL_TABLET | Freq: Two times a day (BID) | ORAL | 1 refills | Status: DC

## 2018-10-17 NOTE — Telephone Encounter (Signed)
Refills sent

## 2018-10-17 NOTE — Telephone Encounter (Signed)
Copied from CRM [id number]. Topic: Quick Communication - Rx Refill/Question >> Oct 17, 2018 10:02 AM [name] wrote: Medication: glimepiride (AMARYL) 4 MG tablet [[id number]] metFORMIN (GLUCOPHAGE) 1000 MG tablet [[id number]] pioglitazone (ACTOS) 30 MG tablet [[id number]]  Has the patient contacted their pharmacy? no Preferred Pharmacy (with phone number or street name): [facility] [phone] [place], [place] - [address] AT [place] [address] [phone] (Phone) [phone] (Fax)    Agent: Please be advised that RX refills may take up to 3 business days. We ask that you follow-up with your pharmacy.

## 2018-11-01 ENCOUNTER — Ambulatory Visit: Payer: Self-pay

## 2018-11-01 ENCOUNTER — Other Ambulatory Visit: Payer: Self-pay

## 2018-11-01 ENCOUNTER — Ambulatory Visit (INDEPENDENT_AMBULATORY_CARE_PROVIDER_SITE_OTHER): Payer: BC Managed Care – PPO | Admitting: Family Medicine

## 2018-11-01 ENCOUNTER — Encounter: Payer: Self-pay | Admitting: Family Medicine

## 2018-11-01 VITALS — BP 122/68 | HR 67 | Resp 16

## 2018-11-01 DIAGNOSIS — M79671 Pain in right foot: Secondary | ICD-10-CM | POA: Insufficient documentation

## 2018-11-01 DIAGNOSIS — M722 Plantar fascial fibromatosis: Secondary | ICD-10-CM | POA: Diagnosis not present

## 2018-11-01 DIAGNOSIS — G8929 Other chronic pain: Secondary | ICD-10-CM

## 2018-11-01 DIAGNOSIS — M25561 Pain in right knee: Secondary | ICD-10-CM | POA: Diagnosis not present

## 2018-11-01 MED ORDER — DICLOFENAC SODIUM 2 % TD SOLN: 1.0000 "application " | Freq: Two times a day (BID) | TRANSDERMAL | 3 refills | Status: DC

## 2018-11-01 NOTE — Assessment & Plan Note (Signed)
He has good joint space of the medial joint line he was pointing to around the insertion of the patellar tendon or the peds anserine but these both appear to be normal.  Could be associated with degenerative meniscus as observed on ultrasound. -Pen[place]aid. -Counseled on home exercise therapy and supportive care. -If no improvement will consider imaging or injection.

## 2018-11-01 NOTE — Assessment & Plan Note (Signed)
He has a fairly high arch on both feet.  This could be that resulted to his foot pain.  Especially if he is standing for most of the day. -Counseled on obtaining insoles

## 2018-11-01 NOTE — Patient Instructions (Signed)
Encantada de conocerte Por favor, intente hielo en la parte inferior de los pies. Tiene arcos altos en la parte inferior de los pies, por lo que debe intentar ponerse una plantilla para ponerse mientras est parado mucho. Por favor prueba los ejercicios. Por favor enveme un mensaje en MyChart con cualquier pregunta. Vuelve a verme en 3 semanas si no mejor.  -- Dr. Jordan Likes

## 2018-11-01 NOTE — Assessment & Plan Note (Signed)
His left foot pain likely is associated with plantar fasciitis.  It does appear to be thicker on the side.  -Injection today. -Counseled on home exercise therapy and supportive care. -If no improvement consider imaging or physical therapy.

## 2018-11-01 NOTE — Progress Notes (Signed)
Shaun Vargas - 53 y.o. male MRN [id number]  Date of birth: 04/26/1966  SUBJECTIVE:  Including CC & ROS.  No chief complaint on file.   Shaun Vargas is a 53 y.o. male that is presenting with right knee pain and left and right foot pain.  The right knee pain is acute.  Is only been going on for a few weeks.  He stands for 10 to 12 hours at his work.  The pain is occurring over the medial joint line.  Denies any swelling.  Has not had improvement with home modalities.  Pain is sharp and stabbing.  It is between mild to moderate.  No history of injury or surgery.  It is localized to the knee..  The left foot pain has been ongoing for about 5 or 6 months.  Is located at the plantar aspect of the heel.  The pain is sharp.  It is worse with the first few steps in the morning.  Has not had any improvement with modalities to date.  No improvement with medications.  Denies any injury or history of surgery.  Is localized to the foot.  It can be sharp and stabbing.  The right foot pain is occurring over the medial aspect of the calcaneus.  No inciting event.  No history of similar pain.  Pain can be sharp and stabbing   Review of Systems  Constitutional: Negative for fever.  HENT: Negative for congestion.   Respiratory: Negative for cough.   Cardiovascular: Negative for chest pain.  Gastrointestinal: Negative for abdominal pain.  Musculoskeletal: Negative for back pain.  Skin: Negative for color change.  Neurological: Negative for tremors.  Hematological: Negative for adenopathy.    HISTORY: Past Medical, Surgical, Social, and Family History Reviewed & Updated per EMR.   Pertinent Historical Findings include:  Past Medical History:  Diagnosis Date  . Anxiety   . Diabetes mellitus 05/22/07    Past Surgical History:  Procedure Laterality Date  . NO PAST SURGERIES      Allergies  Allergen Reactions  . Bee Venom Other (See Comments)    Reaction unknown    Family History  Problem  Relation Age of Onset  . Coronary artery disease Neg Hx   . Hypertension Neg Hx   . Stroke Neg Hx   . Prostate cancer Neg Hx   . Colon cancer Neg Hx   . Diabetes Neg Hx      Social History   Socioeconomic History  . Marital status: Married    Spouse name: Not on file  . Number of children: 2  . Years of education: Not on file  . Highest education level: Not on file  Occupational History  . 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 Financial resource strain: Not on file  . Food insecurity:    Worry: Not on file    Inability: Not on file  . Transportation needs:    Medical: Not on file    Non-medical: Not on file  Tobacco Use  . Smoking status: Never Smoker  . Smokeless tobacco: Never Used  Substance and Sexual Activity  . Alcohol use: Yes    Comment: rarely  . Drug use: Not on file  . Sexual activity: Not on file  Lifestyle  . Physical activity:    Days per week: Not on file    Minutes per session: Not on file  . Stress: Not on file  Relationships  . Social connections:  Talks on phone: Not on file    Gets together: Not on file    Attends religious service: Not on file    Active member of club or organization: Not on file    Attends meetings of clubs or organizations: Not on file    Relationship status: Not on file  . Intimate partner violence:    Fear of current or ex partner: Not on file    Emotionally abused: Not on file    Physically abused: Not on file    Forced sexual activity: Not on file  Other Topics Concern  . Not on file  Social History Narrative   From [place]   Lives w/ wife, wife's son and mother   His children are in [place]     PHYSICAL EXAM:  VS: BP 122/68   Pulse 67   Resp 16   SpO2 97%  Physical Exam Gen: NAD, alert, cooperative with exam, well-appearing ENT: normal lips, normal nasal mucosa,  Eye: normal EOM, normal conjunctiva and lids CV:  no edema, +2 pedal pulses   Resp: no accessory muscle use, non-labored,  Skin: no  rashes, no areas of induration  Neuro: normal tone, normal sensation to touch Psych:  normal insight, alert and oriented MSK:  Right knee: No obvious effusion. Normal range of motion. No instability with valgus and varus stress testing. Negative Murray's test. No pain with patellar grind. No specific area of tenderness over the medial joint line.   Left foot: No swelling or ecchymosis. Tenderness to palpation over the plantar calcaneus. Normal range of motion. Right foot: No swelling or ecchymosis. Some tenderness palpation over the medial calcaneus. Normal range of motion. Normal strength resistance. Neurovascular intact  pes cavus foot bilaterally. Well-maintained arches upon standing.   Limited ultrasound: Right knee, left foot, right foot:  Right knee: No obvious effusion. He does have spurring at the insertion into the patella of the quadriceps tendon. Normal-appearing patellar tendon. Good joint space of the medial joint line.  There appears to be a mild effusion of the anterior horn of the medial meniscus.  This could represent a degenerative tear. No changes observed at the insertion at the peds anserine bursa.  Left foot: There appears to be swelling and thickening of the plantar fascia.  It measures around 0.72 mm.  Right foot.: Plantar fascia appears and measures as normal.  No changes at the posterior tibialis.  Summary: Right knee has findings suggestive degenerative meniscus irritation.  Left foot with findings suggestive of plantar fasciitis  Ultrasound and interpretation by Clare Gandy, MD   Aspiration/Injection Procedure Note Shaun Bright Jan 10, 1966  Procedure: Injection Indications: Left foot pain  Procedure Details Consent: Risks of procedure as well as the alternatives and risks of each were explained to the (patient/caregiver).  Consent for procedure obtained. Time Out: Verified patient identification, verified procedure, site/side was  marked, verified correct patient position, special equipment/implants available, medications/allergies/relevent history reviewed, required imaging and test results available.  Performed.  The area was cleaned with iodine and alcohol swabs.    The left plantar fascia was injected using 1 cc's of 40 mg Depo-Medrol and 3 cc's of 0.5% bupivacaine with a 25 1 1/2" needle.  Ultrasound was used. Images were obtained in long views showing the injection.     A sterile dressing was applied.  Patient did tolerate procedure well.      ASSESSMENT & PLAN:   Plantar fasciitis of left foot His left foot pain likely is associated  with plantar fasciitis.  It does appear to be thicker on the side.  -Injection today. -Counseled on home exercise therapy and supportive care. -If no improvement consider imaging or physical therapy.  Chronic pain of right knee He has good joint space of the medial joint line he was pointing to around the insertion of the patellar tendon or the peds anserine but these both appear to be normal.  Could be associated with degenerative meniscus as observed on ultrasound. -Pennsaid. -Counseled on home exercise therapy and supportive care. -If no improvement will consider imaging or injection.  Right foot pain He has a fairly high arch on both feet.  This could be that resulted to his foot pain.  Especially if he is standing for most of the day. -Counseled on obtaining insoles

## 2019-04-04 ENCOUNTER — Other Ambulatory Visit: Payer: Self-pay

## 2019-04-04 ENCOUNTER — Ambulatory Visit: Payer: BC Managed Care – PPO | Admitting: Internal Medicine

## 2019-04-04 ENCOUNTER — Other Ambulatory Visit: Payer: Self-pay | Admitting: Internal Medicine

## 2019-04-04 ENCOUNTER — Encounter: Payer: Self-pay | Admitting: Internal Medicine

## 2019-04-04 VITALS — BP 146/78 | HR 77 | Temp 97.5°F | Resp 16 | Ht 70.0 in | Wt 225.2 lb

## 2019-04-04 DIAGNOSIS — E119 Type 2 diabetes mellitus without complications: Secondary | ICD-10-CM | POA: Diagnosis not present

## 2019-04-04 DIAGNOSIS — Z0000 Encounter for general adult medical examination without abnormal findings: Secondary | ICD-10-CM

## 2019-04-04 DIAGNOSIS — Z23 Encounter for immunization: Secondary | ICD-10-CM

## 2019-04-04 DIAGNOSIS — E785 Hyperlipidemia, unspecified: Secondary | ICD-10-CM

## 2019-04-04 MED ORDER — METFORMIN HCL 1000 MG PO TABS: 1000.0000 mg | ORAL_TABLET | Freq: Two times a day (BID) | ORAL | 1 refills | Status: DC

## 2019-04-04 MED ORDER — GLIMEPIRIDE 4 MG PO TABS: 8.0000 mg | ORAL_TABLET | Freq: Every day | ORAL | 1 refills | Status: DC

## 2019-04-04 MED ORDER — PIOGLITAZONE HCL 30 MG PO TABS: 30.0000 mg | ORAL_TABLET | Freq: Every day | ORAL | 1 refills | Status: DC

## 2019-04-04 NOTE — Patient Instructions (Addendum)
GO TO THE LAB : Get the blood work     GO TO THE FRONT DESK Schedule your next appointment   for a checkup in 4 to 6 months   Tome una aspirina de 81 mg cada dia  regrese la muestra de heces   tomese la presion 2 veces por semana, debe estar entre   110/65 y  135/85.

## 2019-04-04 NOTE — Progress Notes (Signed)
Subjective:    Patient ID: Shaun Vargas, male    DOB: [birth year], 53 y.o.   MRN: [id number]  DOS:  04/04/2019 Type of visit - description: Routine checkup In general feeling well. Good compliance with diabetes medicine. Not taking an aspirin, "I always forget to take"    Review of Systems  Denies chest pain or difficulty breathing No nausea, vomiting, diarrhea No dysuria, gross hematuria difficulty urinating  Past Medical History:  Diagnosis Date  . Anxiety   . Diabetes mellitus 05/22/07    Past Surgical History:  Procedure Laterality Date  . NO PAST SURGERIES      Social History   Socioeconomic History  . Marital status: Married    Spouse name: Not on file  . Number of children: 2  . Years of education: Not on file  . Highest education level: Not on file  Occupational History  . 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 Financial resource strain: Not on file  . Food insecurity    Worry: Not on file    Inability: Not on file  . Transportation needs    Medical: Not on file    Non-medical: Not on file  Tobacco Use  . Smoking status: Never Smoker  . Smokeless tobacco: Never Used  Substance and Sexual Activity  . Alcohol use: Yes    Comment: rarely  . Drug use: Not on file  . Sexual activity: Not on file  Lifestyle  . Physical activity    Days per week: Not on file    Minutes per session: Not on file  . Stress: Not on file  Relationships  . Social Herbalist on phone: Not on file    Gets together: Not on file    Attends religious service: Not on file    Active member of club or organization: Not on file    Attends meetings of clubs or organizations: Not on file    Relationship status: Not on file  . Intimate partner violence    Fear of current or ex partner: Not on file    Emotionally abused: Not on file    Physically abused: Not on file    Forced sexual activity: Not on file  Other Topics Concern  . Not on file  Social  History Narrative   From [place]   Lives w/ wife, wife's son and mother   His children are in [place]      Allergies as of 04/04/2019      Reactions   Bee Venom Other (See Comments)   Reaction unknown      Medication List       Accurate as of April 04, 2019 11:59 PM. If you have any questions, ask your nurse or doctor.        Aspirin 81 81 MG EC tablet Generic drug: aspirin Take 1 tablet (81 mg total) by mouth daily. Swallow whole.   Diclofenac Sodium 2 % Soln Commonly known as: Pennsaid Place 1 application onto the skin 2 (two) times daily.   glimepiride 4 MG tablet Commonly known as: AMARYL Take 2 tablets (8 mg total) by mouth daily with breakfast.   metFORMIN 1000 MG tablet Commonly known as: GLUCOPHAGE Take 1 tablet (1,000 mg total) by mouth 2 (two) times daily with a meal.   pioglitazone 30 MG tablet Commonly known as: Actos Take 1 tablet (30 mg total) by mouth daily.  Objective:   Physical Exam BP (!) 146/78 (BP Location: Left Arm, Patient Position: Sitting, Cuff Size: Normal)   Pulse 77   Temp (!) 97.5 F (36.4 C) (Temporal)   Resp 16   Ht 5\' 10"  (1.778 m)   Wt 225 lb 4 oz (102.2 kg)   SpO2 98%   BMI 32.32 kg/m  General: Well developed, NAD, BMI noted Neck: No  thyromegaly  HEENT:  Normocephalic . Face symmetric, atraumatic Lungs:  CTA B Normal respiratory effort, no intercostal retractions, no accessory muscle use. Heart: RRR,  no murmur.  No pretibial edema bilaterally  Abdomen:  Not distended, soft, non-tender. No rebound or rigidity.   Skin: Exposed areas without rash. Not pale. Not jaundice DRE: Brown stools, prostate is not enlarged and not nodular Neurologic:  alert & oriented X3.  Speech normal, gait appropriate for age and unassisted Strength symmetric and appropriate for age.  Psych: Cognition and judgment appear intact.  Cooperative with normal attention span and concentration.  Behavior appropriate. No anxious or  depressed appearing.     Assessment    Assessment   DM 2008  Hyperlipidemia Anxiety  Allergic to bumble bees sting, epi-pen rx 12-2016  PLAN: DM: Currently on glimepiride, Metformin and Actos.  Last A1c satisfactory.  Check CMP, A1c, CBC Hyperlipidemia: Diet controlled, check a FLP Restart aspirin 81 for primary prevention Here for ROV but is due for a CPX so I review his preventive care: See separate documentation RTC 4 to 6 months

## 2019-04-04 NOTE — Telephone Encounter (Signed)
Appt later today.

## 2019-04-04 NOTE — Progress Notes (Signed)
Pre visit review using our clinic review tool, if applicable. No additional management support is needed unless otherwise documented below in the visit note. 

## 2019-04-05 LAB — COMPREHENSIVE METABOLIC PANEL
AG Ratio: 1.4 (calc) (ref 1.0–2.5)
ALT: 21 U/L (ref 9–46)
AST: 22 U/L (ref 10–35)
Albumin: 4.4 g/dL (ref 3.6–5.1)
Alkaline phosphatase (APISO): 70 U/L (ref 35–144)
BUN: 20 mg/dL (ref 7–25)
CO2: 25 mmol/L (ref 20–32)
Calcium: 9.5 mg/dL (ref 8.6–10.3)
Chloride: 102 mmol/L (ref 98–110)
Creat: 0.73 mg/dL (ref 0.70–1.33)
Globulin: 3.1 g/dL (calc) (ref 1.9–3.7)
Glucose, Bld: 72 mg/dL (ref 65–99)
Potassium: 4.3 mmol/L (ref 3.5–5.3)
Sodium: 138 mmol/L (ref 135–146)
Total Bilirubin: 0.6 mg/dL (ref 0.2–1.2)
Total Protein: 7.5 g/dL (ref 6.1–8.1)

## 2019-04-05 LAB — CBC WITH DIFFERENTIAL/PLATELET
Absolute Monocytes: 644 cells/uL (ref 200–950)
Basophils Absolute: 28 cells/uL (ref 0–200)
Basophils Relative: 0.6 %
Eosinophils Absolute: 19 cells/uL (ref 15–500)
Eosinophils Relative: 0.4 %
HCT: 42.8 % (ref 38.5–50.0)
Hemoglobin: 13.6 g/dL (ref 13.2–17.1)
Lymphs Abs: 1626 cells/uL (ref 850–3900)
MCH: 26.6 pg — ABNORMAL LOW (ref 27.0–33.0)
MCHC: 31.8 g/dL — ABNORMAL LOW (ref 32.0–36.0)
MCV: 83.8 fL (ref 80.0–100.0)
MPV: 10 fL (ref 7.5–12.5)
Monocytes Relative: 13.7 %
Neutro Abs: 2383 cells/uL (ref 1500–7800)
Neutrophils Relative %: 50.7 %
Platelets: 323 10*3/uL (ref 140–400)
RBC: 5.11 10*6/uL (ref 4.20–5.80)
RDW: 13.2 % (ref 11.0–15.0)
Total Lymphocyte: 34.6 %
WBC: 4.7 10*3/uL (ref 3.8–10.8)

## 2019-04-05 LAB — HEMOGLOBIN A1C
Hgb A1c MFr Bld: 6.2 % of total Hgb — ABNORMAL HIGH (ref <5.7)
Mean Plasma Glucose: 131 (calc)
eAG (mmol/L): 7.3 (calc)

## 2019-04-05 LAB — LIPID PANEL
Cholesterol: 188 mg/dL (ref <200)
HDL: 74 mg/dL (ref >=40)
LDL Cholesterol (Calc): 99 mg/dL (calc)
Non-HDL Cholesterol (Calc): 114 mg/dL (calc) (ref <130)
Total CHOL/HDL Ratio: 2.5 (calc) (ref <5.0)
Triglycerides: 60 mg/dL (ref <150)

## 2019-04-05 LAB — PSA: PSA: 0.5 ng/mL (ref <=4.0)

## 2019-04-05 LAB — MICROALBUMIN / CREATININE URINE RATIO
Creatinine, Urine: 223 mg/dL (ref 20–320)
Microalb Creat Ratio: 6 mcg/mg creat (ref <30)
Microalb, Ur: 1.4 mg/dL

## 2019-04-05 NOTE — Assessment & Plan Note (Signed)
DM: Currently on glimepiride, Metformin and Actos.  Last A1c satisfactory.  Check CMP, A1c, CBC Hyperlipidemia: Diet controlled, check a FLP Restart aspirin 81 for primary prevention Here for ROV but is due for a CPX so I review his preventive care: See separate documentation RTC 4 to 6 months

## 2019-04-05 NOTE — Assessment & Plan Note (Signed)
Here for ROV, preventive care discussed: Td 2013  pnm shot 2015 - prevnar 02-2015 -Flu shot today - CCS: remote Cscope? No records , options d/w pt , concerned about cost, will do an IFOB -Prostate cancer screening: DRE normal, no symptoms, check a PSA

## 2019-08-12 LAB — HM DIABETES EYE EXAM

## 2019-08-18 ENCOUNTER — Encounter: Payer: Self-pay | Admitting: Internal Medicine

## 2019-09-05 ENCOUNTER — Encounter: Payer: Self-pay | Admitting: Internal Medicine

## 2019-09-05 ENCOUNTER — Ambulatory Visit (INDEPENDENT_AMBULATORY_CARE_PROVIDER_SITE_OTHER): Payer: No Typology Code available for payment source | Admitting: Internal Medicine

## 2019-09-05 ENCOUNTER — Other Ambulatory Visit: Payer: Self-pay

## 2019-09-05 VITALS — BP 144/77 | HR 98 | Temp 97.5°F | Resp 16 | Ht 70.0 in | Wt 230.5 lb

## 2019-09-05 DIAGNOSIS — E119 Type 2 diabetes mellitus without complications: Secondary | ICD-10-CM

## 2019-09-05 DIAGNOSIS — Z1211 Encounter for screening for malignant neoplasm of colon: Secondary | ICD-10-CM

## 2019-09-05 NOTE — Progress Notes (Signed)
   Subjective:    Patient ID: Shaun Vargas, male    DOB: 1965/07/28, 54 y.o.   MRN: [id number]  DOS:  09/05/2019 Type of visit - description: Routine checkup Today we talk about diabetes & also colon cancer screening. In general feels well.    Review of Systems Denies chest pain no difficulty breathing. No fever chills or cough Denies lower extremity paresthesias  Past Medical History:  Diagnosis Date  . Anxiety   . Diabetes mellitus 05/22/07    Past Surgical History:  Procedure Laterality Date  . NO PAST SURGERIES      Allergies as of 09/05/2019      Reactions   Bee Venom Other (See Comments)   Reaction unknown      Medication List       Accurate as of September 05, 2019 11:59 PM. If you have any questions, ask your nurse or doctor.        Aspirin 81 81 MG EC tablet Generic drug: aspirin Take 1 tablet (81 mg total) by mouth daily. Swallow whole.   Diclofenac Sodium 2 % Soln Commonly known as: Pennsaid Place 1 application onto the skin 2 (two) times daily.   glimepiride 4 MG tablet Commonly known as: AMARYL Take 2 tablets (8 mg total) by mouth daily with breakfast.   metFORMIN 1000 MG tablet Commonly known as: GLUCOPHAGE Take 1 tablet (1,000 mg total) by mouth 2 (two) times daily with a meal.   pioglitazone 30 MG tablet Commonly known as: Actos Take 1 tablet (30 mg total) by mouth daily.          Objective:   Physical Exam BP (!) 144/77 (BP Location: Left Arm, Patient Position: Sitting, Cuff Size: Normal)   Pulse 98   Temp (!) 97.5 F (36.4 C) (Temporal)   Resp 16   Ht '5\' 10"'$  (1.778 m)   Wt 230 lb 8 oz (104.6 kg)   SpO2 93%   BMI 33.07 kg/m  General:   Well developed, NAD, BMI noted. HEENT:  Normocephalic . Face symmetric, atraumatic Lungs:  CTA B Normal respiratory effort, no intercostal retractions, no accessory muscle use. Heart: RRR,  no murmur.  Lower extremities: no pretibial edema bilaterally  Skin: Not pale. Not jaundice DM foot  exam: No edema, good pedal pulses, pinprick normal Neurologic:  alert & oriented X3.  Speech normal, gait appropriate for age and unassisted Psych--  Cognition and judgment appear intact.  Cooperative with normal attention span and concentration.  Behavior appropriate. No anxious or depressed appearing.      Assessment    Assessment   DM 2008  Hyperlipidemia Anxiety  Allergic to bumble bees sting, epi-pen rx 12-2016  PLAN: DM: Continue glimepiride Metformin, Actos.  Check BMP, A1c. Feet exam normal.  No symptoms of neuropathy Preventive care: To have his second Covid shot tomorrow. Did not return I fob, encouraged to do, new kit provided. RTC for labs next week (our lab is closed) RTC to see me 4 to 6 months    This visit occurred during the SARS-CoV-2 public health emergency.  Safety protocols were in place, including screening questions prior to the visit, additional usage of staff PPE, and extensive cleaning of exam room while observing appropriate contact time as indicated for disinfecting solutions.

## 2019-09-05 NOTE — Patient Instructions (Addendum)
Regrese la proxima semana para sacarse la sangre, haga una cita    GO TO THE FRONT DESK, PLEASE SCHEDULE YOUR APPOINTMENTS Come back for   blood work next week  Come back for a checkup in 4 to 6 months

## 2019-09-05 NOTE — Progress Notes (Signed)
Pre visit review using our clinic review tool, if applicable. No additional management support is needed unless otherwise documented below in the visit note. 

## 2019-09-07 NOTE — Assessment & Plan Note (Signed)
DM: Continue glimepiride Metformin, Actos.  Check BMP, A1c. Feet exam normal.  No symptoms of neuropathy Preventive care: To have his second Covid shot tomorrow. Did not return I fob, encouraged to do, new kit provided. RTC for labs next week (our lab is closed) RTC to see me 4 to 6 months

## 2019-09-15 ENCOUNTER — Other Ambulatory Visit: Payer: Self-pay | Admitting: Internal Medicine

## 2019-09-18 ENCOUNTER — Other Ambulatory Visit: Payer: No Typology Code available for payment source

## 2019-10-13 ENCOUNTER — Other Ambulatory Visit: Payer: Self-pay | Admitting: Internal Medicine

## 2019-11-16 ENCOUNTER — Other Ambulatory Visit: Payer: Self-pay | Admitting: Internal Medicine

## 2019-12-01 ENCOUNTER — Other Ambulatory Visit: Payer: Self-pay | Admitting: Internal Medicine

## 2020-01-26 ENCOUNTER — Telehealth: Payer: Self-pay | Admitting: Internal Medicine

## 2020-01-26 MED ORDER — GLIMEPIRIDE 4 MG PO TABS: 8.0000 mg | ORAL_TABLET | Freq: Every day | ORAL | 1 refills | Status: DC

## 2020-01-26 NOTE — Telephone Encounter (Signed)
Refills were sent on 12/02/2019 to [facility] on [address]- however I have resent.

## 2020-01-26 NOTE — Telephone Encounter (Signed)
Pt states went to pharmacy to pick up meds and pharmacy indicated does not have refill for glimepiride (Amaryl) 4 mg tablet (pt was informed that still has a refill from [phone], but pt mentioned that pharmacy indicated pt does not have any left) [facility] [address] [facility]. Pt states is needing refill please advise.

## 2020-02-06 ENCOUNTER — Ambulatory Visit: Payer: No Typology Code available for payment source | Admitting: Internal Medicine

## 2020-02-12 ENCOUNTER — Other Ambulatory Visit: Payer: Self-pay

## 2020-02-12 ENCOUNTER — Ambulatory Visit (INDEPENDENT_AMBULATORY_CARE_PROVIDER_SITE_OTHER): Payer: PRIVATE HEALTH INSURANCE | Admitting: Internal Medicine

## 2020-02-12 ENCOUNTER — Encounter: Payer: Self-pay | Admitting: Internal Medicine

## 2020-02-12 VITALS — BP 148/84 | HR 72 | Temp 98.1°F | Resp 16 | Ht 70.0 in | Wt 234.5 lb

## 2020-02-12 DIAGNOSIS — E119 Type 2 diabetes mellitus without complications: Secondary | ICD-10-CM

## 2020-02-12 DIAGNOSIS — I10 Essential (primary) hypertension: Secondary | ICD-10-CM | POA: Diagnosis not present

## 2020-02-12 DIAGNOSIS — Z23 Encounter for immunization: Secondary | ICD-10-CM | POA: Diagnosis not present

## 2020-02-12 DIAGNOSIS — E785 Hyperlipidemia, unspecified: Secondary | ICD-10-CM

## 2020-02-12 MED ORDER — LOSARTAN POTASSIUM 25 MG PO TABS
25.0000 mg | ORAL_TABLET | Freq: Every day | ORAL | 1 refills | Status: DC
Start: 2020-02-12 — End: 2020-04-16

## 2020-02-12 MED ORDER — ATORVASTATIN CALCIUM 10 MG PO TABS
10.0000 mg | ORAL_TABLET | Freq: Every day | ORAL | 1 refills | Status: DC
Start: 2020-02-12 — End: 2020-04-16

## 2020-02-12 NOTE — Progress Notes (Signed)
Pre visit review using our clinic review tool, if applicable. No additional management support is needed unless otherwise documented below in the visit note. 

## 2020-02-12 NOTE — Patient Instructions (Signed)
Start atorvastatin 10 mg, 1 tablet every night  Start losartan 25 mg, 1 tablet every night  If possible, check the  blood pressure weekly BP GOAL is between 110/65 and  135/85. If it is consistently higher or lower, let me know    GO TO THE LAB : Get the blood work     GO TO THE FRONT DESK, PLEASE SCHEDULE YOUR APPOINTMENTS Come back for blood work in 2 weeks     Come back for a physical exam in 3 months

## 2020-02-12 NOTE — Progress Notes (Signed)
Subjective:    Patient ID: Shaun Vargas, male    DOB: [birth year], 54 y.o.   MRN: [id number]  DOS:  02/12/2020 Type of visit - description: Routine visit Feeling well. No major concerns Normal amb  BPs  BP Readings from Last 3 Encounters:  02/12/20 (!) 148/84  09/05/19 (!) 144/77  04/04/19 (!) 146/78    Wt Readings from Last 3 Encounters:  02/12/20 234 lb 8 oz (106.4 kg)  09/05/19 230 lb 8 oz (104.6 kg)  04/04/19 225 lb 4 oz (102.2 kg)     Review of Systems See above   Past Medical History:  Diagnosis Date  . Anxiety   . Diabetes mellitus 05/22/07    Past Surgical History:  Procedure Laterality Date  . NO PAST SURGERIES      Allergies as of 02/12/2020      Reactions   Bee Venom Other (See Comments)   Reaction unknown      Medication List       Accurate as of February 12, 2020 10:27 AM. If you have any questions, ask your nurse or doctor.        Aspirin 81 81 MG EC tablet Generic drug: aspirin Take 1 tablet (81 mg total) by mouth daily. Swallow whole.   Diclofenac Sodium 2 % Soln Commonly known as: Pennsaid Place 1 application onto the skin 2 (two) times daily.   glimepiride 4 MG tablet Commonly known as: AMARYL Take 2 tablets (8 mg total) by mouth daily with breakfast.   metFORMIN 1000 MG tablet Commonly known as: GLUCOPHAGE Take 1 tablet (1,000 mg total) by mouth 2 (two) times daily with a meal.   pioglitazone 30 MG tablet Commonly known as: ACTOS Take 1 tablet (30 mg total) by mouth daily.          Objective:   Physical Exam BP (!) 148/84 (BP Location: Left Arm, Patient Position: Sitting, Cuff Size: Normal)   Pulse 72   Temp 98.1 F (36.7 C) (Oral)   Resp 16   Ht 5\' 10"  (1.778 m)   Wt 234 lb 8 oz (106.4 kg)   SpO2 98%   BMI 33.65 kg/m  General:   Well developed, NAD, BMI noted. HEENT:  Normocephalic . Face symmetric, atraumatic Lungs:  CTA B Normal respiratory effort, no intercostal retractions, no accessory muscle  use. Heart: RRR,  no murmur.  Lower extremities: no pretibial edema bilaterally  Skin: Not pale. Not jaundice Neurologic:  alert & oriented X3.  Speech normal, gait appropriate for age and unassisted Psych--  Cognition and judgment appear intact.  Cooperative with normal attention span and concentration.  Behavior appropriate. No anxious or depressed appearing.      Assessment     Assessment   DM 2008  Hyperlipidemia Anxiety  Allergic to bumble bees sting, epi-pen rx 12-2016  PLAN: Labs ordered at the last visit in April were not done. DM: Currently on glimepiride, Metformin, Actos.  Check CMP, A1c Hyperlipidemia: Diet controlled, last LDL 99.  Explained the benefits of statin despite normal LDL.  We agreed to start Lipitor 10 mg daily. HTN: BP has been consistently in the 140s here, it was also elevated today.  Recommend to start losartan 25 mg.  Benefits discussed. Preventive care: Had COVID vaccination, request a flu shot today. RTC for labs 2 weeks: AST, ALT, BMP RTC CPX 3 months    This visit occurred during the SARS-CoV-2 public health emergency.  Safety protocols were in place, including screening  questions prior to the visit, additional usage of staff PPE, and extensive cleaning of exam room while observing appropriate contact time as indicated for disinfecting solutions.

## 2020-02-13 LAB — COMPREHENSIVE METABOLIC PANEL
AG Ratio: 1.7 (calc) (ref 1.0–2.5)
ALT: 22 U/L (ref 9–46)
AST: 19 U/L (ref 10–35)
Albumin: 4.3 g/dL (ref 3.6–5.1)
Alkaline phosphatase (APISO): 64 U/L (ref 35–144)
BUN: 18 mg/dL (ref 7–25)
CO2: 28 mmol/L (ref 20–32)
Calcium: 9.4 mg/dL (ref 8.6–10.3)
Chloride: 102 mmol/L (ref 98–110)
Creat: 0.8 mg/dL (ref 0.70–1.33)
Globulin: 2.6 g/dL (calc) (ref 1.9–3.7)
Glucose, Bld: 102 mg/dL — ABNORMAL HIGH (ref 65–99)
Potassium: 4.8 mmol/L (ref 3.5–5.3)
Sodium: 138 mmol/L (ref 135–146)
Total Bilirubin: 0.7 mg/dL (ref 0.2–1.2)
Total Protein: 6.9 g/dL (ref 6.1–8.1)

## 2020-02-13 LAB — HEMOGLOBIN A1C
Hgb A1c MFr Bld: 6.7 % of total Hgb — ABNORMAL HIGH (ref <5.7)
Mean Plasma Glucose: 146 (calc)
eAG (mmol/L): 8.1 (calc)

## 2020-02-15 NOTE — Assessment & Plan Note (Signed)
Labs ordered at the last visit in April were not done. DM: Currently on glimepiride, Metformin, Actos.  Check CMP, A1c Hyperlipidemia: Diet controlled, last LDL 99.  Explained the benefits of statin despite normal LDL.  We agreed to start Lipitor 10 mg daily. HTN: BP has been consistently in the 140s here, it was also elevated today.  Recommend to start losartan 25 mg.  Benefits discussed. Preventive care: Had COVID vaccination, request a flu shot today. RTC for labs 2 weeks: AST, ALT, BMP RTC CPX 3 months

## 2020-02-27 ENCOUNTER — Other Ambulatory Visit: Payer: PRIVATE HEALTH INSURANCE

## 2020-03-25 ENCOUNTER — Encounter: Payer: Self-pay | Admitting: Internal Medicine

## 2020-04-16 ENCOUNTER — Other Ambulatory Visit: Payer: Self-pay | Admitting: Internal Medicine

## 2020-04-21 ENCOUNTER — Other Ambulatory Visit: Payer: Self-pay | Admitting: Internal Medicine

## 2020-06-01 ENCOUNTER — Other Ambulatory Visit: Payer: Self-pay

## 2020-06-01 ENCOUNTER — Encounter: Payer: Self-pay | Admitting: Internal Medicine

## 2020-06-01 ENCOUNTER — Ambulatory Visit (INDEPENDENT_AMBULATORY_CARE_PROVIDER_SITE_OTHER): Payer: PRIVATE HEALTH INSURANCE | Admitting: Internal Medicine

## 2020-06-01 VITALS — BP 132/79 | HR 73 | Temp 98.3°F | Resp 16 | Ht 70.0 in | Wt 238.0 lb

## 2020-06-01 DIAGNOSIS — E785 Hyperlipidemia, unspecified: Secondary | ICD-10-CM | POA: Diagnosis not present

## 2020-06-01 DIAGNOSIS — Z1159 Encounter for screening for other viral diseases: Secondary | ICD-10-CM

## 2020-06-01 DIAGNOSIS — Z0000 Encounter for general adult medical examination without abnormal findings: Secondary | ICD-10-CM | POA: Diagnosis not present

## 2020-06-01 DIAGNOSIS — E119 Type 2 diabetes mellitus without complications: Secondary | ICD-10-CM

## 2020-06-01 DIAGNOSIS — I10 Essential (primary) hypertension: Secondary | ICD-10-CM

## 2020-06-01 LAB — LIPID PANEL
Cholesterol: 157 mg/dL (ref 0–200)
HDL: 75 mg/dL (ref >39.00)
LDL Cholesterol: 68 mg/dL (ref 0–99)
NonHDL: 82.36
Total CHOL/HDL Ratio: 2
Triglycerides: 70 mg/dL (ref 0.0–149.0)
VLDL: 14 mg/dL (ref 0.0–40.0)

## 2020-06-01 LAB — CBC WITH DIFFERENTIAL/PLATELET
Basophils Absolute: 0 10*3/uL (ref 0.0–0.1)
Basophils Relative: 0.6 % (ref 0.0–3.0)
Eosinophils Absolute: 0 10*3/uL (ref 0.0–0.7)
Eosinophils Relative: 0.4 % (ref 0.0–5.0)
HCT: 43 % (ref 39.0–52.0)
Hemoglobin: 13.8 g/dL (ref 13.0–17.0)
Lymphocytes Relative: 31.2 % (ref 12.0–46.0)
Lymphs Abs: 1.3 10*3/uL (ref 0.7–4.0)
MCHC: 32 g/dL (ref 30.0–36.0)
MCV: 83.9 fl (ref 78.0–100.0)
Monocytes Absolute: 0.5 10*3/uL (ref 0.1–1.0)
Monocytes Relative: 12.3 % — ABNORMAL HIGH (ref 3.0–12.0)
Neutro Abs: 2.3 10*3/uL (ref 1.4–7.7)
Neutrophils Relative %: 55.5 % (ref 43.0–77.0)
Platelets: 307 10*3/uL (ref 150.0–400.0)
RBC: 5.12 Mil/uL (ref 4.22–5.81)
RDW: 14.8 % (ref 11.5–15.5)
WBC: 4.1 10*3/uL (ref 4.0–10.5)

## 2020-06-01 LAB — HEMOGLOBIN A1C: Hgb A1c MFr Bld: 6.9 % — ABNORMAL HIGH (ref 4.6–6.5)

## 2020-06-01 LAB — BASIC METABOLIC PANEL
BUN: 19 mg/dL (ref 6–23)
CO2: 31 mEq/L (ref 19–32)
Calcium: 9.4 mg/dL (ref 8.4–10.5)
Chloride: 101 mEq/L (ref 96–112)
Creatinine, Ser: 0.78 mg/dL (ref 0.40–1.50)
GFR: 101.47 mL/min (ref >60.00)
Glucose, Bld: 126 mg/dL — ABNORMAL HIGH (ref 70–99)
Potassium: 4.7 mEq/L (ref 3.5–5.1)
Sodium: 137 mEq/L (ref 135–145)

## 2020-06-01 MED ORDER — EPINEPHRINE 0.3 MG/0.3ML IJ SOAJ: 0.3000 mg | INTRAMUSCULAR | 2 refills | Status: DC | PRN

## 2020-06-01 NOTE — Progress Notes (Signed)
Pre visit review using our clinic review tool, if applicable. No additional management support is needed unless otherwise documented below in the visit note. 

## 2020-06-01 NOTE — Patient Instructions (Addendum)
Happy Birthday!  TRAIGA DE REGRESO LA PRUEBA DE HECES, DEJELO EN EL FRONT DESK  REGRESE EN 6 MESES  GO TO THE LAB : Get the blood work     GO TO THE FRONT DESK, PLEASE SCHEDULE YOUR APPOINTMENTS Come back for a checkup in 6 months

## 2020-06-01 NOTE — Assessment & Plan Note (Signed)
-  Td 2013  -pnm shot 2015 - prevnar 02-2015 - covid vax x 3 - had a Flu shot   - CCS: remote Cscope? No records , options d/w pt, previous iFOB not returned, cost is still an issue, he elected a IFOB again. -Prostate cancer screening: DRE and PSA 03/2019: normal -Diet exercise discussed

## 2020-06-01 NOTE — Assessment & Plan Note (Signed)
Here for CPX DM: Continue glimepiride, Metformin, Actos.  Advised to skip medications particularly glimepiride if he is planning to fast.  Ambulatory CBGs 90-120.  Checking labs today Hyperlipidemia: On Lipitor, checking labs. Allergic reactions to bumble bee stings: Importance of having an EpiPen discussed, prescription provided. RTC 6 months

## 2020-06-01 NOTE — Progress Notes (Signed)
Subjective:    Patient ID: Shaun Vargas, male    DOB: [birth year], 55 y.o.   MRN: [id number]  DOS:  06/01/2020 Type of visit - description: CPX  Since the last office visit is doing okay.  Has no concerns.  Review of Systems   A 14 point review of systems is negative    Past Medical History:  Diagnosis Date  . Anxiety   . Diabetes mellitus 05/22/07    Past Surgical History:  Procedure Laterality Date  . NO PAST SURGERIES      Allergies as of 06/01/2020      Reactions   Bee Venom Other (See Comments)   Reaction unknown      Medication List       Accurate as of June 01, 2020  9:46 PM. If you have any questions, ask your nurse or doctor.        STOP taking these medications   Diclofenac Sodium 2 % Soln Commonly known as: Pennsaid Stopped by: Willow Ora, MD     TAKE these medications   aspirin 81 MG EC tablet Take 1 tablet (81 mg total) by mouth daily. Swallow whole.   atorvastatin 10 MG tablet Commonly known as: LIPITOR Take 1 tablet (10 mg total) by mouth daily.   EPINEPHrine 0.3 mg/0.3 mL Soaj injection Commonly known as: EpiPen 2-Pak Inject 0.3 mg into the muscle as needed for anaphylaxis. Started by: Willow Ora, MD   glimepiride 4 MG tablet Commonly known as: AMARYL Take 2 tablets (8 mg total) by mouth daily with breakfast.   losartan 25 MG tablet Commonly known as: COZAAR Take 1 tablet (25 mg total) by mouth daily.   metFORMIN 1000 MG tablet Commonly known as: GLUCOPHAGE Take 1 tablet (1,000 mg total) by mouth 2 (two) times daily with a meal.   pioglitazone 30 MG tablet Commonly known as: ACTOS Take 1 tablet (30 mg total) by mouth daily.          Objective:   Physical Exam BP 132/79 (BP Location: Left Arm, Patient Position: Sitting, Cuff Size: Normal)   Pulse 73   Temp 98.3 F (36.8 C) (Oral)   Resp 16   Ht 5\' 10"  (1.778 m)   Wt 238 lb (108 kg)   SpO2 99%   BMI 34.15 kg/m  General: Well developed, NAD, BMI noted Neck: No   thyromegaly  HEENT:  Normocephalic . Face symmetric, atraumatic Lungs:  CTA B Normal respiratory effort, no intercostal retractions, no accessory muscle use. Heart: RRR,  no murmur.  Abdomen:  Not distended, soft, non-tender. No rebound or rigidity.   Lower extremities: no pretibial edema bilaterally  Skin: Exposed areas without rash. Not pale. Not jaundice Neurologic:  alert & oriented X3.  Speech normal, gait appropriate for age and unassisted Strength symmetric and appropriate for age.  Psych: Cognition and judgment appear intact.  Cooperative with normal attention span and concentration.  Behavior appropriate. No anxious or depressed appearing.     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  Advised to skip medications particularly glimepiride if he is planning to fast.  Ambulatory CBGs 90-120.  Checking labs today Hyperlipidemia: On Lipitor, checking labs. Allergic reactions to bumble bee stings: Importance of having an EpiPen discussed, prescription provided. RTC 6 months  This visit occurred during the SARS-CoV-2 public health emergency.  Safety protocols were in place, including screening questions prior to the visit, additional usage of staff PPE, and extensive cleaning of exam room while observing appropriate contact time as indicated for disinfecting solutions.

## 2020-06-02 LAB — HEPATITIS C ANTIBODY
Hepatitis C Ab: NONREACTIVE
SIGNAL TO CUT-OFF: 0.1 (ref <1.00)

## 2020-07-23 ENCOUNTER — Other Ambulatory Visit: Payer: Self-pay | Admitting: Internal Medicine

## 2020-08-20 LAB — HM DIABETES EYE EXAM

## 2020-08-23 ENCOUNTER — Encounter: Payer: Self-pay | Admitting: Internal Medicine

## 2020-12-03 ENCOUNTER — Ambulatory Visit (INDEPENDENT_AMBULATORY_CARE_PROVIDER_SITE_OTHER): Payer: No Typology Code available for payment source | Admitting: Internal Medicine

## 2020-12-03 ENCOUNTER — Encounter: Payer: Self-pay | Admitting: Internal Medicine

## 2020-12-03 ENCOUNTER — Other Ambulatory Visit: Payer: Self-pay

## 2020-12-03 VITALS — BP 140/72 | HR 76 | Temp 98.2°F | Resp 18 | Ht 70.0 in | Wt 241.0 lb

## 2020-12-03 DIAGNOSIS — E119 Type 2 diabetes mellitus without complications: Secondary | ICD-10-CM | POA: Diagnosis not present

## 2020-12-03 DIAGNOSIS — E785 Hyperlipidemia, unspecified: Secondary | ICD-10-CM | POA: Diagnosis not present

## 2020-12-03 DIAGNOSIS — I10 Essential (primary) hypertension: Secondary | ICD-10-CM

## 2020-12-03 NOTE — Patient Instructions (Addendum)
Check the  blood pressure 2  times a month   BP GOAL is between 110/65 and  135/85. If it is consistently higher or lower, let me know   GO TO THE LAB : Get the blood work     GO TO THE FRONT DESK, PLEASE SCHEDULE YOUR APPOINTMENTS Come back for  a physical exam by [phone]

## 2020-12-03 NOTE — Progress Notes (Signed)
Subjective:    Patient ID: Shaun Vargas, male    DOB: 02-20-1966, 55 y.o.   MRN: [id number]  DOS:  12/03/2020 Type of visit - description: Routine checkup  Since the last office visit is doing well. No ambulatory BPs. Ambulatory CBGs in the 130s Weight gain noted.  BP Readings from Last 3 Encounters:  12/03/20 140/72  06/01/20 132/79  02/12/20 (!) 148/84   Wt Readings from Last 3 Encounters:  12/03/20 241 lb (109.3 kg)  06/01/20 238 lb (108 kg)  02/12/20 234 lb 8 oz (106.4 kg)     Review of Systems Denies chest pain no difficulty breathing No lower extremity paresthesias  Past Medical History:  Diagnosis Date   Anxiety    Diabetes mellitus 05/22/07    Past Surgical History:  Procedure Laterality Date   NO PAST SURGERIES      Allergies as of 12/03/2020       Reactions   Bee Venom Other (See Comments)   Reaction unknown        Medication List        Accurate as of December 03, 2020 11:59 PM. If you have any questions, ask your nurse or doctor.          aspirin 81 MG EC tablet Take 1 tablet (81 mg total) by mouth daily. Swallow whole.   atorvastatin 10 MG tablet Commonly known as: LIPITOR Take 1 tablet (10 mg total) by mouth daily.   EPINEPHrine 0.3 mg/0.3 mL Soaj injection Commonly known as: EpiPen 2-Pak Inject 0.3 mg into the muscle as needed for anaphylaxis.   glimepiride 4 MG tablet Commonly known as: AMARYL Take 2 tablets (8 mg total) by mouth daily with breakfast.   losartan 25 MG tablet Commonly known as: COZAAR Take 1 tablet (25 mg total) by mouth daily.   metFORMIN 1000 MG tablet Commonly known as: GLUCOPHAGE Take 1 tablet (1,000 mg total) by mouth 2 (two) times daily with a meal.   pioglitazone 30 MG tablet Commonly known as: ACTOS Take 1 tablet (30 mg total) by mouth daily.           Objective:   Physical Exam BP 140/72 (BP Location: Left Arm, Patient Position: Sitting, Cuff Size: Normal)   Pulse 76   Temp 98.2 F (36.8  C) (Oral)   Resp 18   Ht 5\' 10"  (1.778 m)   Wt 241 lb (109.3 kg)   SpO2 98%   BMI 34.58 kg/m  General:   Well developed, NAD, BMI noted. HEENT:  Normocephalic . Face symmetric, atraumatic Lungs:  CTA B Normal respiratory effort, no intercostal retractions, no accessory muscle use. Heart: RRR,  no murmur.  DM foot exam: No edema, good pedal pulses, pinprick examination normal Skin: Not pale. Not jaundice Neurologic:  alert & oriented X3.  Speech normal, gait appropriate for age and unassisted Psych--  Cognition and judgment appear intact.  Cooperative with normal attention span and concentration.  Behavior appropriate. No anxious or depressed appearing.      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  CBGs in the 130s, weight gain noted, admits to sedentary lifestyle, changed roles in his job and now is sedentary.  Encourage to take a walk daily.  Check A1c HTN: BP is 140/72, would like to see systolic BPs in the 130s, he does not like to  check at home, continue losartan, check BMP, reassess on RTC. Preventive care: Recommend COVID-vaccine #4 and a flu shot this fall RTC CPX 05-2018  This visit occurred during the SARS-CoV-2 public health emergency.  Safety protocols were in place, including screening questions prior to the visit, additional usage of staff PPE, and extensive cleaning of exam room while observing appropriate contact time as indicated for disinfecting solutions.

## 2020-12-04 LAB — BASIC METABOLIC PANEL
BUN: 19 mg/dL (ref 7–25)
CO2: 26 mmol/L (ref 20–32)
Calcium: 9.3 mg/dL (ref 8.6–10.3)
Chloride: 100 mmol/L (ref 98–110)
Creat: 0.74 mg/dL (ref 0.70–1.33)
Glucose, Bld: 176 mg/dL — ABNORMAL HIGH (ref 65–99)
Potassium: 4.9 mmol/L (ref 3.5–5.3)
Sodium: 136 mmol/L (ref 135–146)

## 2020-12-04 LAB — HEMOGLOBIN A1C
Hgb A1c MFr Bld: 6.4 % of total Hgb — ABNORMAL HIGH (ref <5.7)
Mean Plasma Glucose: 137 mg/dL
eAG (mmol/L): 7.6 mmol/L

## 2020-12-04 LAB — AST: AST: 26 U/L (ref 10–35)

## 2020-12-04 LAB — ALT: ALT: 24 U/L (ref 9–46)

## 2020-12-04 NOTE — Assessment & Plan Note (Signed)
DM: Currently on glimepiride, metformin, Actos.  CBGs in the 130s, weight gain noted, admits to sedentary lifestyle, changed roles in his job and now is sedentary.  Encourage to take a walk daily.  Check A1c HTN: BP is 140/72, would like to see systolic BPs in the 130s, he does not like to check at home, continue losartan, check BMP, reassess on RTC. Preventive care: Recommend COVID-vaccine #4 and a flu shot this fall RTC CPX 05-2018

## 2021-01-21 ENCOUNTER — Other Ambulatory Visit: Payer: Self-pay | Admitting: Internal Medicine

## 2021-06-02 ENCOUNTER — Ambulatory Visit (INDEPENDENT_AMBULATORY_CARE_PROVIDER_SITE_OTHER): Payer: No Typology Code available for payment source | Admitting: Internal Medicine

## 2021-06-02 ENCOUNTER — Encounter: Payer: Self-pay | Admitting: Internal Medicine

## 2021-06-02 VITALS — BP 128/84 | HR 74 | Temp 98.1°F | Resp 16 | Ht 70.0 in | Wt 241.4 lb

## 2021-06-02 DIAGNOSIS — Z23 Encounter for immunization: Secondary | ICD-10-CM | POA: Diagnosis not present

## 2021-06-02 DIAGNOSIS — Z125 Encounter for screening for malignant neoplasm of prostate: Secondary | ICD-10-CM | POA: Diagnosis not present

## 2021-06-02 DIAGNOSIS — E785 Hyperlipidemia, unspecified: Secondary | ICD-10-CM

## 2021-06-02 DIAGNOSIS — E119 Type 2 diabetes mellitus without complications: Secondary | ICD-10-CM | POA: Diagnosis not present

## 2021-06-02 DIAGNOSIS — I10 Essential (primary) hypertension: Secondary | ICD-10-CM | POA: Diagnosis not present

## 2021-06-02 DIAGNOSIS — Z0000 Encounter for general adult medical examination without abnormal findings: Secondary | ICD-10-CM | POA: Diagnosis not present

## 2021-06-02 LAB — BASIC METABOLIC PANEL
BUN: 21 mg/dL (ref 6–23)
CO2: 31 mEq/L (ref 19–32)
Calcium: 9.3 mg/dL (ref 8.4–10.5)
Chloride: 101 mEq/L (ref 96–112)
Creatinine, Ser: 0.89 mg/dL (ref 0.40–1.50)
GFR: 96.82 mL/min (ref >60.00)
Glucose, Bld: 145 mg/dL — ABNORMAL HIGH (ref 70–99)
Potassium: 4.6 mEq/L (ref 3.5–5.1)
Sodium: 138 mEq/L (ref 135–145)

## 2021-06-02 LAB — CBC WITH DIFFERENTIAL/PLATELET
Basophils Absolute: 0 10*3/uL (ref 0.0–0.1)
Basophils Relative: 0.7 % (ref 0.0–3.0)
Eosinophils Absolute: 0 10*3/uL (ref 0.0–0.7)
Eosinophils Relative: 0.4 % (ref 0.0–5.0)
HCT: 41.7 % (ref 39.0–52.0)
Hemoglobin: 13 g/dL (ref 13.0–17.0)
Lymphocytes Relative: 29.6 % (ref 12.0–46.0)
Lymphs Abs: 1.4 10*3/uL (ref 0.7–4.0)
MCHC: 31.2 g/dL (ref 30.0–36.0)
MCV: 84 fl (ref 78.0–100.0)
Monocytes Absolute: 0.6 10*3/uL (ref 0.1–1.0)
Monocytes Relative: 13.3 % — ABNORMAL HIGH (ref 3.0–12.0)
Neutro Abs: 2.6 10*3/uL (ref 1.4–7.7)
Neutrophils Relative %: 56 % (ref 43.0–77.0)
Platelets: 273 10*3/uL (ref 150.0–400.0)
RBC: 4.96 Mil/uL (ref 4.22–5.81)
RDW: 14.7 % (ref 11.5–15.5)
WBC: 4.7 10*3/uL (ref 4.0–10.5)

## 2021-06-02 LAB — LIPID PANEL
Cholesterol: 148 mg/dL (ref 0–200)
HDL: 64.8 mg/dL (ref >39.00)
LDL Cholesterol: 69 mg/dL (ref 0–99)
NonHDL: 83.34
Total CHOL/HDL Ratio: 2
Triglycerides: 73 mg/dL (ref 0.0–149.0)
VLDL: 14.6 mg/dL (ref 0.0–40.0)

## 2021-06-02 LAB — HEMOGLOBIN A1C: Hgb A1c MFr Bld: 7.6 % — ABNORMAL HIGH (ref 4.6–6.5)

## 2021-06-02 LAB — PSA: PSA: 0.65 ng/mL (ref 0.10–4.00)

## 2021-06-02 NOTE — Progress Notes (Signed)
Subjective:    Patient ID: Shaun Vargas, male    DOB: [birth year], 56 y.o.   MRN: [id number]  DOS:  06/02/2021 Type of visit - description: cpx  Since the last office visit is doing well. Did report libido has decreased gradually over the last few years.  No ED  Wt Readings from Last 3 Encounters:  06/02/21 241 lb 6 oz (109.5 kg)  12/03/20 241 lb (109.3 kg)  06/01/20 238 lb (108 kg)     Review of Systems  Other than above, a 14 point review of systems is negative       Past Medical History:  Diagnosis Date   Anxiety    Diabetes mellitus 05/22/07    Past Surgical History:  Procedure Laterality Date   NO PAST SURGERIES     Social History   Socioeconomic History   Marital status: Married    Spouse name: Not on file   Number of children: 2   Years of education: Not on file   Highest education level: Not on file  Occupational History   Occupation: Works @ a [employer]   Tobacco Use   Smoking status: Never   Smokeless tobacco: Never  Substance and Sexual Activity   Alcohol use: Yes    Comment: rarely   Drug use: Not on file   Sexual activity: Not on file  Other Topics Concern   Not on file  Social History Narrative   From [place]   Lives w/ wife,   mother in law   His children are in [place]   Social Determinants of Health   Financial Resource Strain: Not on file  Food Insecurity: Not on file  Transportation Needs: Not on file  Physical Activity: Not on file  Stress: Not on file  Social Connections: Not on file  Intimate Partner Violence: Not on file    Allergies as of 06/02/2021       Reactions   Bee Venom Other (See Comments)   Reaction unknown        Medication List        Accurate as of June 02, 2021 11:59 PM. If you have any questions, ask your nurse or doctor.          aspirin 81 MG EC tablet Take 1 tablet (81 mg total) by mouth daily. Swallow whole.   atorvastatin 10 MG tablet Commonly known as: LIPITOR Take 1 tablet (10 mg  total) by mouth daily.   EPINEPHrine 0.3 mg/0.3 mL Soaj injection Commonly known as: EpiPen 2-Pak Inject 0.3 mg into the muscle as needed for anaphylaxis.   glimepiride 4 MG tablet Commonly known as: AMARYL Take 2 tablets (8 mg total) by mouth daily with breakfast.   losartan 25 MG tablet Commonly known as: COZAAR Take 1 tablet (25 mg total) by mouth daily.   metFORMIN 1000 MG tablet Commonly known as: GLUCOPHAGE Take 1 tablet (1,000 mg total) by mouth 2 (two) times daily with a meal.   pioglitazone 30 MG tablet Commonly known as: ACTOS Take 1 tablet (30 mg total) by mouth daily.           Objective:   Physical Exam BP 128/84 (BP Location: Left Arm, Patient Position: Sitting, Cuff Size: Normal)    Pulse 74    Temp 98.1 F (36.7 C) (Oral)    Resp 16    Ht 5\' 10"  (1.778 m)    Wt 241 lb 6 oz (109.5 kg)    SpO2 98%  BMI 34.63 kg/m  General: Well developed, NAD, BMI noted Neck: No  thyromegaly  HEENT:  Normocephalic . Face symmetric, atraumatic Lungs:  CTA B Normal respiratory effort, no intercostal retractions, no accessory muscle use. Heart: RRR,  no murmur.  Abdomen:  Not distended, soft, non-tender. No rebound or rigidity.   Lower extremities: no pretibial edema bilaterally DRE: Normal sphincter tone, brown stools, no blood, prostate normal Skin: Exposed areas without rash. Not pale. Not jaundice Neurologic:  alert & oriented X3.  Speech normal, gait appropriate for age and unassisted Strength symmetric and appropriate for age.  Psych: Cognition and judgment appear intact.  Cooperative with normal attention span and concentration.  Behavior appropriate. No anxious or depressed appearing.     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  Check labs HTN: No ambulatory BPs, BP today is very good, continue losartan. High  cholesterol: On atorvastatin, check FLP Aspirin: Currently not taking, benefits discussed, recommend to restart Mild decreased libido: No other symptoms, observation. RTC 6 months    This visit occurred during the SARS-CoV-2 public health emergency.  Safety protocols were in place, including screening questions prior to the visit, additional usage of staff PPE, and extensive cleaning of exam room while observing appropriate contact time as indicated for disinfecting solutions.

## 2021-06-02 NOTE — Patient Instructions (Addendum)
Regrese en 2 meses para su segunda vacuna de shingrix , saque una cita  GO TO THE LAB : Get the blood work     GO TO THE FRONT DESK, PLEASE SCHEDULE YOUR APPOINTMENTS Come back for   a checkup in 6 months

## 2021-06-03 ENCOUNTER — Encounter: Payer: Self-pay | Admitting: Internal Medicine

## 2021-06-03 NOTE — Assessment & Plan Note (Signed)
-  Td 2013 -Shingrix: #1 today, next in 2 months.  -pnm 23: 2015 - pnm 13: 02-2015 - covid vax : per pt 05-01-2021, thus UTD - had a Flu shot   - CCS: remote Cscope? No records , last year we agreed on iFOB did not return it. 3 options discussed today, new I fob provided. -Prostate cancer screening: DRE  normal, no sxs, labs -Diet exercise discussed - labs:BMP, FLP, CBC, A1c, PSA -ACP information in Spanish provided

## 2021-06-03 NOTE — Assessment & Plan Note (Signed)
Here for CPX DM: On glimepiride, metformin, Actos.  Check labs HTN: No ambulatory BPs, BP today is very good, continue losartan. High cholesterol: On atorvastatin, check FLP Aspirin: Currently not taking, benefits discussed, recommend to restart Mild decreased libido: No other symptoms, observation. RTC 6 months

## 2021-07-28 ENCOUNTER — Other Ambulatory Visit: Payer: Self-pay | Admitting: Internal Medicine

## 2021-10-19 ENCOUNTER — Encounter: Payer: Self-pay | Admitting: Internal Medicine

## 2021-12-02 ENCOUNTER — Encounter: Payer: Self-pay | Admitting: Internal Medicine

## 2021-12-02 ENCOUNTER — Ambulatory Visit: Payer: No Typology Code available for payment source | Admitting: Internal Medicine

## 2021-12-02 VITALS — BP 134/74 | HR 64 | Temp 98.2°F | Resp 18 | Ht 70.0 in | Wt 229.4 lb

## 2021-12-02 DIAGNOSIS — I10 Essential (primary) hypertension: Secondary | ICD-10-CM

## 2021-12-02 DIAGNOSIS — Z1211 Encounter for screening for malignant neoplasm of colon: Secondary | ICD-10-CM | POA: Diagnosis not present

## 2021-12-02 DIAGNOSIS — Z23 Encounter for immunization: Secondary | ICD-10-CM

## 2021-12-02 DIAGNOSIS — E119 Type 2 diabetes mellitus without complications: Secondary | ICD-10-CM | POA: Diagnosis not present

## 2021-12-02 LAB — HEMOGLOBIN A1C: Hgb A1c MFr Bld: 6.9 % — ABNORMAL HIGH (ref 4.6–6.5)

## 2021-12-02 LAB — ALT: ALT: 23 U/L (ref 0–53)

## 2021-12-02 LAB — AST: AST: 23 U/L (ref 0–37)

## 2021-12-02 NOTE — Progress Notes (Unsigned)
   Subjective:    Patient ID: Shaun Vargas, male    DOB: [birth year], 56 y.o.   MRN: [id number]  DOS:  12/02/2021 Type of visit - description: Follow-up  Since the last office visit is feeling well. has improved his diet significantly and is taking his medications daily.  Wt Readings from Last 3 Encounters:  12/02/21 229 lb 6 oz (104 kg)  06/02/21 241 lb 6 oz (109.5 kg)  12/03/20 241 lb (109.3 kg)   Review of Systems See above   Past Medical History:  Diagnosis Date   Anxiety    Diabetes mellitus 05/22/07    Past Surgical History:  Procedure Laterality Date   NO PAST SURGERIES      Current Outpatient Medications  Medication Instructions   aspirin EC 81 mg, Daily   atorvastatin (LIPITOR) 10 mg, Oral, Daily   EPINEPHrine (EPIPEN 2-PAK) 0.3 mg, Intramuscular, As needed   glimepiride (AMARYL) 4 MG tablet TAKE 2 TABLETS BY MOUTH DAILY WITH BREAKFAST.   losartan (COZAAR) 25 mg, Oral, Daily   metFORMIN (GLUCOPHAGE) 1,000 mg, Oral, 2 times daily with meals   pioglitazone (ACTOS) 30 mg, Oral, Daily       Objective:   Physical Exam BP 134/74   Pulse 64   Temp 98.2 F (36.8 C) (Oral)   Resp 18   Ht 5\' 10"  (1.778 m)   Wt 229 lb 6 oz (104 kg)   SpO2 98%   BMI 32.91 kg/m  General:   Well developed, NAD, BMI noted. HEENT:  Normocephalic . Face symmetric, atraumatic Lungs:  CTA B Normal respiratory effort, no intercostal retractions, no accessory muscle use. Heart: RRR,  no murmur.  DM foot exam: wnl Skin: Not pale. Not jaundice Neurologic:  alert & oriented X3.  Speech normal, gait appropriate for age and unassisted Psych--  Cognition and judgment appear intact.  Cooperative with normal attention span and concentration.  Behavior appropriate. No anxious or depressed appearing.      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  Reports he is doing great following portion control, has decreased alcohol to a minimum, taking medications as prescribed.  Physical activity remains the same.  + Weight loss noted. Praised  Foot exam negative today. Plan: Check A1c HTN : BP satisfactory, continue losartan.  Last BMP okay High cholesterol: Last LDL 69, check LFTs, continue Lipitor. ASA: was rec , declines to take Preventive care: Shingles #2 today.  He brought I fob sample today RTC 6 months CPX

## 2021-12-02 NOTE — Patient Instructions (Addendum)
  Lo felicito por mejorar su dieta!  Per our records you are due for your diabetic eye exam. Please contact your eye doctor to schedule an appointment. Please have them send copies of your office visit notes to [place]. Our fax number is [phone]. If you need a referral to an eye doctor please let us know.   GO TO THE LAB : Get the blood work     GO TO THE FRONT DESK, PLEASE SCHEDULE YOUR APPOINTMENTS Come back for a physical exam in 6 months

## 2021-12-04 NOTE — Assessment & Plan Note (Signed)
DM: Last A1c increased from 6.4-7.6, recommended to continue glimepiride, metformin, Actos with better compliance and work on diet.  Reports he is doing great following portion control, has decreased alcohol to a minimum, taking medications as prescribed.  Physical activity remains the same.  + Weight loss noted. Praised  Foot exam negative today. Plan: Check A1c HTN : BP satisfactory, continue losartan.  Last BMP okay High cholesterol: Last LDL 69, check LFTs, continue Lipitor. ASA: was rec , declines to take Preventive care: Shingles #2 today.  He brought I fob sample today RTC 6 months CPX

## 2021-12-05 LAB — FECAL OCCULT BLOOD, IMMUNOCHEMICAL: Fecal Occult Bld: NEGATIVE

## 2022-01-17 ENCOUNTER — Encounter: Payer: Self-pay | Admitting: Internal Medicine

## 2022-01-25 ENCOUNTER — Other Ambulatory Visit: Payer: Self-pay | Admitting: Internal Medicine

## 2022-06-02 ENCOUNTER — Ambulatory Visit (INDEPENDENT_AMBULATORY_CARE_PROVIDER_SITE_OTHER): Payer: No Typology Code available for payment source | Admitting: Internal Medicine

## 2022-06-02 ENCOUNTER — Encounter: Payer: Self-pay | Admitting: Internal Medicine

## 2022-06-02 VITALS — BP 138/78 | HR 61 | Temp 98.2°F | Resp 18 | Ht 70.0 in | Wt 226.2 lb

## 2022-06-02 DIAGNOSIS — I10 Essential (primary) hypertension: Secondary | ICD-10-CM | POA: Diagnosis not present

## 2022-06-02 DIAGNOSIS — Z23 Encounter for immunization: Secondary | ICD-10-CM

## 2022-06-02 DIAGNOSIS — E119 Type 2 diabetes mellitus without complications: Secondary | ICD-10-CM | POA: Diagnosis not present

## 2022-06-02 DIAGNOSIS — E785 Hyperlipidemia, unspecified: Secondary | ICD-10-CM | POA: Diagnosis not present

## 2022-06-02 DIAGNOSIS — Z0000 Encounter for general adult medical examination without abnormal findings: Secondary | ICD-10-CM | POA: Diagnosis not present

## 2022-06-02 LAB — CBC WITH DIFFERENTIAL/PLATELET
Basophils Absolute: 0 10*3/uL (ref 0.0–0.1)
Basophils Relative: 0.7 % (ref 0.0–3.0)
Eosinophils Absolute: 0 10*3/uL (ref 0.0–0.7)
Eosinophils Relative: 0.4 % (ref 0.0–5.0)
HCT: 40.9 % (ref 39.0–52.0)
Hemoglobin: 13.4 g/dL (ref 13.0–17.0)
Lymphocytes Relative: 36.8 % (ref 12.0–46.0)
Lymphs Abs: 1.4 10*3/uL (ref 0.7–4.0)
MCHC: 32.7 g/dL (ref 30.0–36.0)
MCV: 83.1 fl (ref 78.0–100.0)
Monocytes Absolute: 0.5 10*3/uL (ref 0.1–1.0)
Monocytes Relative: 13.4 % — ABNORMAL HIGH (ref 3.0–12.0)
Neutro Abs: 1.9 10*3/uL (ref 1.4–7.7)
Neutrophils Relative %: 48.7 % (ref 43.0–77.0)
Platelets: 292 10*3/uL (ref 150.0–400.0)
RBC: 4.92 Mil/uL (ref 4.22–5.81)
RDW: 15 % (ref 11.5–15.5)
WBC: 3.8 10*3/uL — ABNORMAL LOW (ref 4.0–10.5)

## 2022-06-02 LAB — LIPID PANEL
Cholesterol: 165 mg/dL (ref 0–200)
HDL: 75.7 mg/dL (ref >39.00)
LDL Cholesterol: 79 mg/dL (ref 0–99)
NonHDL: 89.49
Total CHOL/HDL Ratio: 2
Triglycerides: 50 mg/dL (ref 0.0–149.0)
VLDL: 10 mg/dL (ref 0.0–40.0)

## 2022-06-02 LAB — COMPREHENSIVE METABOLIC PANEL
ALT: 20 U/L (ref 0–53)
AST: 22 U/L (ref 0–37)
Albumin: 4.4 g/dL (ref 3.5–5.2)
Alkaline Phosphatase: 80 U/L (ref 39–117)
BUN: 23 mg/dL (ref 6–23)
CO2: 31 mEq/L (ref 19–32)
Calcium: 9.4 mg/dL (ref 8.4–10.5)
Chloride: 103 mEq/L (ref 96–112)
Creatinine, Ser: 0.85 mg/dL (ref 0.40–1.50)
GFR: 97.48 mL/min (ref >60.00)
Glucose, Bld: 136 mg/dL — ABNORMAL HIGH (ref 70–99)
Potassium: 4.5 mEq/L (ref 3.5–5.1)
Sodium: 140 mEq/L (ref 135–145)
Total Bilirubin: 0.5 mg/dL (ref 0.2–1.2)
Total Protein: 7.3 g/dL (ref 6.0–8.3)

## 2022-06-02 LAB — MICROALBUMIN / CREATININE URINE RATIO
Creatinine,U: 147.6 mg/dL
Microalb Creat Ratio: 1.8 mg/g (ref 0.0–30.0)
Microalb, Ur: 2.6 mg/dL — ABNORMAL HIGH (ref 0.0–1.9)

## 2022-06-02 LAB — B12 AND FOLATE PANEL
Folate: 17.2 ng/mL (ref >5.9)
Vitamin B-12: 261 pg/mL (ref 211–911)

## 2022-06-02 LAB — HEMOGLOBIN A1C: Hgb A1c MFr Bld: 6.8 % — ABNORMAL HIGH (ref 4.6–6.5)

## 2022-06-02 NOTE — Patient Instructions (Addendum)
Aparentemente ya es tiempo de hacerse un examen de los ojos. Por favor llame a su doctor de los ojos (ophthalmologist, optometrist) y saque una cita. Solicite que nos envien una copia de la visita a nuestro fax: [phone]. Si necesitara un "referral" nosotros lo podemos hacer    Vaccines I recommend:  Covid booster  Check the  blood pressure regularly BP GOAL is between 110/65 and  135/85. If it is consistently higher or lower, let me know      GO TO THE LAB : Get the blood work     [place], [employer] back for   a checkup in 6 months

## 2022-06-02 NOTE — Progress Notes (Signed)
Subjective:    Patient ID: Shaun Vargas, male    DOB: 1965-10-22, 57 y.o.   MRN: [id number]  DOS:  06/02/2022 Type of visit - description: cpx  Since the last office visit is doing well other than a mild right knee discomfort going on for 2 weeks.  No injury, no swelling.  Review of Systems See above   Past Medical History:  Diagnosis Date   Anxiety    Diabetes mellitus 05/22/07    Past Surgical History:  Procedure Laterality Date   NO PAST SURGERIES      Current Outpatient Medications  Medication Instructions   atorvastatin (LIPITOR) 10 mg, Oral, Daily   EPINEPHrine (EPIPEN 2-PAK) 0.3 mg, Intramuscular, As needed   glimepiride (AMARYL) 8 mg, Oral, Daily with breakfast   losartan (COZAAR) 25 mg, Oral, Daily   metFORMIN (GLUCOPHAGE) 1,000 mg, Oral, 2 times daily with meals   pioglitazone (ACTOS) 30 mg, Oral, Daily       Objective:   Physical Exam BP 138/78   Pulse 61   Temp 98.2 F (36.8 C) (Oral)   Resp 18   Ht 5\' 10"  (1.778 m)   Wt 226 lb 4 oz (102.6 kg)   SpO2 97%   BMI 32.46 kg/m  General: Well developed, NAD, BMI noted Neck: No  thyromegaly  HEENT:  Normocephalic . Face symmetric, atraumatic Lungs:  CTA B Normal respiratory effort, no intercostal retractions, no accessory muscle use. Heart: RRR,  no murmur.  Abdomen:  Not distended, soft, non-tender. No rebound or rigidity.   Lower extremities: no pretibial edema bilaterally  Skin: Exposed areas without rash. Not pale. Not jaundice Neurologic:  alert & oriented X3.  Speech normal, gait appropriate for age and unassisted Strength symmetric and appropriate for age.  Psych: Cognition and judgment appear intact.  Cooperative with normal attention span and concentration.  Behavior appropriate. No anxious or depressed appearing.     Assessment     Assessment   DM 2008  HTN Hyperlipidemia Anxiety  Allergic to bumble bees sting, epi-pen rx 12-2016.  Strongly declined a RF 06/02/2022 because had  other stings without a reaction.  Benefits of EpiPen discussed     PLAN: Here for CPX DM: Good compliance with glimepiride, metformin and pioglitazone.  Check A1c. HTN: On losartan 25 mg, checking labs, BP today satisfactory, recommend to check at home. Hyperlipidemia: On atorvastatin, checking labs Bumblebee allergy: Strongly declined a RF 06/02/2022 because had other stings without a reaction.  Benefits of EpiPen discussed RTC 6 months   -Td 2013, declines booster today -Shingrix: x2 (had a reaction to 2nd dose).  -pnm 23: 2015 - pnm 13: 02-2015 - covid vax : rec booster  - Flu shot  today  - CCS: remote Cscope? No records, returned a IFOB 11-2021: negative, next 1 year  -Labs reviewed, will get: CMP FLP CBC A1c B12 -Prostate cancer screening: DRE-PSA 2023 neg, no FH, no sxs, reassess next year -Diet exercise discussed -Healthcare POA: Info provided in Spanish  ==== DM: Last A1c increased from 6.4-7.6, recommended to continue glimepiride, metformin, Actos with better compliance and work on diet.  Reports he is doing great following portion control, has decreased alcohol to a minimum, taking medications as prescribed.  Physical activity remains the same.  + Weight loss noted. Praised  Foot exam negative today. Plan: Check A1c HTN : BP satisfactory, continue losartan.  Last BMP okay High cholesterol: Last LDL 69, check LFTs, continue Lipitor. ASA: was rec ,  declines to take Preventive care: Shingles #2 today.  He brought I fob sample today RTC 6 months CPX

## 2022-06-03 ENCOUNTER — Encounter: Payer: Self-pay | Admitting: Internal Medicine

## 2022-06-03 NOTE — Assessment & Plan Note (Signed)
Here for CPX DM: Good compliance with glimepiride, metformin and pioglitazone.  Check A1c. HTN: On losartan 25 mg, checking labs, BP today satisfactory, recommend to check at home. Hyperlipidemia: On atorvastatin, checking labs Bumblebee allergy: Strongly declined a epipen  RF 06/02/2022 because had other stings without a reaction.  Benefits of EpiPen discussed RTC 6 months

## 2022-06-03 NOTE — Assessment & Plan Note (Signed)
-  Td 2013, declines booster today -Shingrix: x2 (had a reaction to 2nd dose).  -pnm 23: 2015 - pnm 13: 02-2015 - covid vax : rec booster  - Flu shot  today  - CCS: remote Cscope? No records, returned a IFOB 11-2021: negative, next 1 year  -Labs reviewed, will get: CMP FLP CBC A1c B12 -Prostate cancer screening: DRE-PSA 2023 neg, no FH, no sxs, reassess next year -Diet exercise discussed -Healthcare POA: Info provided in [place]

## 2022-08-16 ENCOUNTER — Other Ambulatory Visit: Payer: Self-pay | Admitting: Internal Medicine

## 2022-08-29 LAB — HM DIABETES EYE EXAM

## 2022-12-15 ENCOUNTER — Ambulatory Visit (INDEPENDENT_AMBULATORY_CARE_PROVIDER_SITE_OTHER): Payer: No Typology Code available for payment source | Admitting: Internal Medicine

## 2022-12-15 VITALS — BP 120/70 | HR 67 | Temp 97.6°F | Resp 18 | Ht 70.0 in | Wt 225.8 lb

## 2022-12-15 DIAGNOSIS — E119 Type 2 diabetes mellitus without complications: Secondary | ICD-10-CM | POA: Diagnosis not present

## 2022-12-15 DIAGNOSIS — I10 Essential (primary) hypertension: Secondary | ICD-10-CM

## 2022-12-15 DIAGNOSIS — Z7984 Long term (current) use of oral hypoglycemic drugs: Secondary | ICD-10-CM

## 2022-12-15 NOTE — Progress Notes (Unsigned)
   Subjective:    Patient ID: Shaun Vargas, male    DOB: [birth year], 57 y.o.   MRN: [id number]  DOS:  12/15/2022 Type of visit - description: f/u  Doing well Amb CBG ok Denies CP-SOB No LE paresthesias   Review of Systems See above   Past Medical History:  Diagnosis Date   Anxiety    Diabetes mellitus 05/22/07    Past Surgical History:  Procedure Laterality Date   NO PAST SURGERIES      Current Outpatient Medications  Medication Instructions   atorvastatin (LIPITOR) 10 mg, Oral, Daily   glimepiride (AMARYL) 8 mg, Oral, Daily with breakfast   losartan (COZAAR) 25 mg, Oral, Daily   metFORMIN (GLUCOPHAGE) 1,000 mg, Oral, 2 times daily with meals   pioglitazone (ACTOS) 30 mg, Oral, Daily       Objective:   Physical Exam BP 120/70 (BP Location: Left Arm, Patient Position: Sitting, Cuff Size: Large)   Pulse 67   Temp 97.6 F (36.4 C) (Oral)   Resp 18   Ht 5\' 10"  (1.778 m)   Wt 225 lb 12.8 oz (102.4 kg)   SpO2 99%   BMI 32.40 kg/m  General:   Well developed, NAD, BMI noted. HEENT:  Normocephalic . Face symmetric, atraumatic Lungs:  CTA B Normal respiratory effort, no intercostal retractions, no accessory muscle use. Heart: RRR,  no murmur.  DM foot exam: wnl Skin: Not pale. Not jaundice Neurologic:  alert & oriented X3.  Speech normal, gait appropriate for age and unassisted Psych--  Cognition and judgment appear intact.  Cooperative with normal attention span and concentration.  Behavior appropriate. No anxious or depressed appearing.      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  Strongly declined a RF 06/02/2022 because had other stings without a reaction.  Benefits of EpiPen discussed   PLAN: DM: amb CBGs 108-130.  Cont glimepiride, metformin, pioglitazone, check a A1C Feet exam neg-wnl HTN:: BP wnl today, encouraged to check amb BPS prn, cont losartan, check a  BMP Hyperlipidemia: last LDL close to goal of 70 Vaccines I recommend -- flu shot (fall) , covid vaccine  RTC CPX 05-2023

## 2022-12-15 NOTE — Patient Instructions (Addendum)
Vaccines I recommend -- flu shot (fall) , covid vaccine   Check the  blood pressure regularly BP GOAL is between 110/65 and  135/85. If it is consistently higher or lower, let me know  Diabetes: You can check your sugars at different times, they right times to do it are: - early in AM fasting  ( blood sugar goal 70-130) - 2 hours after a meal (blood sugar goal less than 180)    GO TO THE LAB : Get the blood work     GO TO THE FRONT DESK, PLEASE SCHEDULE YOUR APPOINTMENTS Come back for   a physical exam by 05-2023

## 2022-12-16 LAB — BASIC METABOLIC PANEL
BUN: 24 mg/dL (ref 7–25)
CO2: 28 mmol/L (ref 20–32)
Calcium: 9.4 mg/dL (ref 8.6–10.3)
Chloride: 102 mmol/L (ref 98–110)
Creat: 0.81 mg/dL (ref 0.70–1.30)
Glucose, Bld: 109 mg/dL — ABNORMAL HIGH (ref 65–99)
Potassium: 4.8 mmol/L (ref 3.5–5.3)
Sodium: 138 mmol/L (ref 135–146)

## 2022-12-16 LAB — HEMOGLOBIN A1C
Hgb A1c MFr Bld: 6.6 % of total Hgb — ABNORMAL HIGH (ref <5.7)
Mean Plasma Glucose: 143 mg/dL
eAG (mmol/L): 7.9 mmol/L

## 2022-12-17 NOTE — Assessment & Plan Note (Signed)
DM: amb CBGs 108-130.  Cont glimepiride, metformin, pioglitazone, check a A1C Feet exam neg-wnl HTN:: BP wnl today, encouraged to check amb BPS prn, cont losartan, check a BMP Hyperlipidemia: last LDL close to goal of 70 Vaccines I recommend -- flu shot (fall) , covid vaccine  RTC CPX 05-2023

## 2023-02-27 ENCOUNTER — Other Ambulatory Visit: Payer: Self-pay | Admitting: Internal Medicine

## 2023-06-21 ENCOUNTER — Telehealth: Payer: Self-pay | Admitting: Internal Medicine

## 2023-06-21 NOTE — Telephone Encounter (Signed)
LVM in spanish for pt to r/s appt on 06-22-23 due to provider will be out sick.

## 2023-06-22 ENCOUNTER — Ambulatory Visit: Payer: No Typology Code available for payment source | Admitting: Internal Medicine

## 2023-08-03 ENCOUNTER — Encounter: Payer: Self-pay | Admitting: Internal Medicine

## 2023-08-03 ENCOUNTER — Ambulatory Visit (INDEPENDENT_AMBULATORY_CARE_PROVIDER_SITE_OTHER): Payer: No Typology Code available for payment source | Admitting: Internal Medicine

## 2023-08-03 VITALS — BP 130/70 | HR 68 | Temp 98.0°F | Resp 16 | Ht 70.0 in | Wt 226.4 lb

## 2023-08-03 DIAGNOSIS — E785 Hyperlipidemia, unspecified: Secondary | ICD-10-CM | POA: Diagnosis not present

## 2023-08-03 DIAGNOSIS — Z7984 Long term (current) use of oral hypoglycemic drugs: Secondary | ICD-10-CM

## 2023-08-03 DIAGNOSIS — I10 Essential (primary) hypertension: Secondary | ICD-10-CM

## 2023-08-03 DIAGNOSIS — Z0001 Encounter for general adult medical examination with abnormal findings: Secondary | ICD-10-CM

## 2023-08-03 DIAGNOSIS — E119 Type 2 diabetes mellitus without complications: Secondary | ICD-10-CM

## 2023-08-03 DIAGNOSIS — Z0000 Encounter for general adult medical examination without abnormal findings: Secondary | ICD-10-CM

## 2023-08-03 DIAGNOSIS — Z1211 Encounter for screening for malignant neoplasm of colon: Secondary | ICD-10-CM

## 2023-08-03 NOTE — Progress Notes (Signed)
 Subjective:    Patient ID: Shaun Vargas, male    DOB: [birth year], 59 y.o.   MRN: [id number]  DOS:  08/03/2023 Type of visit - description: CPX  Here for CPX. Chronic medical problems addressed. He feels well. Reports good compliance with medications. Denies any lower extremity paresthesias.   Review of Systems  Other than above, a 14 point review of systems is negative     Past Medical History:  Diagnosis Date   Anxiety    Diabetes mellitus 05/22/07    Past Surgical History:  Procedure Laterality Date   NO PAST SURGERIES     Social History   Socioeconomic History   Marital status: Married    Spouse name: Not on file   Number of children: 2   Years of education: Not on file   Highest education level: Not on file  Occupational History   Occupation: Works @ a [employer]   Tobacco Use   Smoking status: Never   Smokeless tobacco: Never  Substance and Sexual Activity   Alcohol use: Yes    Comment: rarely   Drug use: Not on file   Sexual activity: Not on file  Other Topics Concern   Not on file  Social History Narrative   From [place]   Lives w/ wife, mother in law   His children are in [place]   Social Drivers of Health   Financial Resource Strain: Not on file  Food Insecurity: Not on file  Transportation Needs: Not on file  Physical Activity: Not on file  Stress: Not on file  Social Connections: Not on file  Intimate Partner Violence: Not on file     Current Outpatient Medications  Medication Instructions   atorvastatin (LIPITOR) 10 mg, Oral, Daily   glimepiride (AMARYL) 8 mg, Oral, Daily with breakfast   losartan (COZAAR) 25 mg, Oral, Daily   metFORMIN (GLUCOPHAGE) 1,000 mg, Oral, 2 times daily with meals   pioglitazone (ACTOS) 30 mg, Oral, Daily       Objective:   Physical Exam BP 130/70   Pulse 68   Temp 98 F (36.7 C) (Oral)   Resp 16   Ht 5\' 10"  (1.778 m)   Wt 226 lb 6 oz (102.7 kg)   SpO2 95%   BMI 32.48 kg/m  General: Well  developed, NAD, BMI noted Neck: No  thyromegaly  HEENT:  Normocephalic . Face symmetric, atraumatic Lungs:  CTA B Normal respiratory effort, no intercostal retractions, no accessory muscle use. Heart: RRR,  no murmur.  Abdomen:  Not distended, soft, non-tender. No rebound or rigidity.   DM foot exam: Good pedal pulses, pinprick examination normal Skin: Exposed areas without rash. Not pale. Not jaundice Neurologic:  alert & oriented X3.  Speech normal, gait appropriate for age and unassisted Strength symmetric and appropriate for age.  Psych: Cognition and judgment appear intact.  Cooperative with normal attention span and concentration.  Behavior appropriate. No anxious or depressed appearing.     Assessment    Assessment   DM 2008  HTN Hyperlipidemia Anxiety  Allergic to bumble bees sting, epi-pen rx 12-2016.  Strongly declined a RF 06/02/2022 because had other stings without a reaction.  Benefits of EpiPen discussed   PLAN: Here for CPX -Td 2013, states he had a booster  3 years ago at [facility]) -Shingrix: x2 (had a reaction to 2nd dose).  -pnm 23: 2015; pnm 13: 02-2015 - Had a flu shot. - Recommmend: covid vax  - CCS: remote Cscope?  No records, returned a IFOB 11-2021: negative. 3 options discussed again, elected Cologuard.  Aware that needs to double check with his insurance for coverage -Labs: CMP FLP CBC A1c TSH PSA -Prostate cancer screening: no FH, no sxs, labs. -Diet exercise discussed We also discussed the following: DM: On glimepiride, metformin and pioglitazone.  Ambulatory CBGs ranged from 110-150 depending on diet. Foot exam today is negative, check labs. HTN: On losartan 25 mg, BP looks very good, check labs. High cholesterol: On atorvastatin.  Labs. Allergies: Declined EpiPen prescription. RTC 4 months.

## 2023-08-03 NOTE — Patient Instructions (Addendum)
 Since I recommend: COVID booster.  We are referring you for Cologuard.    GO TO THE LAB : Get the blood work     Please go to the front desk: Arrange for a follow-up in 4 months      Aparentemente ya es tiempo de [place] un examen de los ojos. Por favor llame a su doctor de los ojos (ophthalmologist, optometrist) y saque una cita. Solicite que nos envien una copia de la visita a nuestro fax: [phone]. Si necesitara un "referral" nosotros lo podemos hacer

## 2023-08-04 ENCOUNTER — Encounter: Payer: Self-pay | Admitting: Internal Medicine

## 2023-08-04 LAB — COMPREHENSIVE METABOLIC PANEL
AG Ratio: 1.5 (calc) (ref 1.0–2.5)
ALT: 26 U/L (ref 9–46)
AST: 30 U/L (ref 10–35)
Albumin: 4.7 g/dL (ref 3.6–5.1)
Alkaline phosphatase (APISO): 70 U/L (ref 35–144)
BUN: 20 mg/dL (ref 7–25)
CO2: 24 mmol/L (ref 20–32)
Calcium: 9.8 mg/dL (ref 8.6–10.3)
Chloride: 100 mmol/L (ref 98–110)
Creat: 0.76 mg/dL (ref 0.70–1.30)
Globulin: 3.2 g/dL (ref 1.9–3.7)
Glucose, Bld: 75 mg/dL (ref 65–99)
Potassium: 4.4 mmol/L (ref 3.5–5.3)
Sodium: 138 mmol/L (ref 135–146)
Total Bilirubin: 0.8 mg/dL (ref 0.2–1.2)
Total Protein: 7.9 g/dL (ref 6.1–8.1)

## 2023-08-04 LAB — MICROALBUMIN / CREATININE URINE RATIO
Creatinine, Urine: 37 mg/dL (ref 20–320)
Microalb Creat Ratio: 5 mg/g{creat} (ref <30)
Microalb, Ur: 0.2 mg/dL

## 2023-08-04 LAB — CBC WITH DIFFERENTIAL/PLATELET
Absolute Lymphocytes: 2736 {cells}/uL (ref 850–3900)
Absolute Monocytes: 835 {cells}/uL (ref 200–950)
Basophils Absolute: 29 {cells}/uL (ref 0–200)
Basophils Relative: 0.4 %
Eosinophils Absolute: 22 {cells}/uL (ref 15–500)
Eosinophils Relative: 0.3 %
HCT: 44.3 % (ref 38.5–50.0)
Hemoglobin: 14 g/dL (ref 13.2–17.1)
MCH: 26.6 pg — ABNORMAL LOW (ref 27.0–33.0)
MCHC: 31.6 g/dL — ABNORMAL LOW (ref 32.0–36.0)
MCV: 84.2 fL (ref 80.0–100.0)
MPV: 9.5 fL (ref 7.5–12.5)
Monocytes Relative: 11.6 %
Neutro Abs: 3578 {cells}/uL (ref 1500–7800)
Neutrophils Relative %: 49.7 %
Platelets: 366 10*3/uL (ref 140–400)
RBC: 5.26 10*6/uL (ref 4.20–5.80)
RDW: 13.3 % (ref 11.0–15.0)
Total Lymphocyte: 38 %
WBC: 7.2 10*3/uL (ref 3.8–10.8)

## 2023-08-04 LAB — HEMOGLOBIN A1C
Hgb A1c MFr Bld: 6.8 %{Hb} — ABNORMAL HIGH (ref <5.7)
Mean Plasma Glucose: 148 mg/dL
eAG (mmol/L): 8.2 mmol/L

## 2023-08-04 LAB — LIPID PANEL
Cholesterol: 164 mg/dL (ref <200)
HDL: 78 mg/dL (ref >=40)
LDL Cholesterol (Calc): 73 mg/dL
Non-HDL Cholesterol (Calc): 86 mg/dL (ref <130)
Total CHOL/HDL Ratio: 2.1 (calc) (ref <5.0)
Triglycerides: 44 mg/dL (ref <150)

## 2023-08-04 LAB — TSH: TSH: 1.51 m[IU]/L (ref 0.40–4.50)

## 2023-08-04 LAB — PSA: PSA: 0.81 ng/mL (ref <=4.00)

## 2023-08-04 NOTE — Assessment & Plan Note (Signed)
 Here for CPX We also discussed the following: DM: On glimepiride, metformin and pioglitazone.  Ambulatory CBGs ranged from 110-150 depending on diet. Foot exam today is negative, check labs. HTN: On losartan 25 mg, BP looks very good, check labs. High cholesterol: On atorvastatin.  Labs. Allergies: Declined EpiPen prescription. RTC 4 months.

## 2023-08-04 NOTE — Assessment & Plan Note (Signed)
 Here for CPX -Td 2013, states he had a booster  3 years ago at [facility]) -Shingrix: x2 (had a reaction to 2nd dose).  -pnm 23: 2015; pnm 13: 02-2015 - Had a flu shot. - Recommmend: covid vax  - CCS: remote Cscope? No records, returned a IFOB 11-2021: negative. 3 options discussed again, elected Cologuard.  Aware that needs to double check with his insurance for coverage -Labs: CMP FLP CBC A1c TSH PSA -Prostate cancer screening: no FH, no sxs, labs. -Diet exercise discussed

## 2023-08-05 ENCOUNTER — Encounter: Payer: Self-pay | Admitting: Internal Medicine

## 2023-08-31 LAB — HM DIABETES EYE EXAM

## 2023-09-04 ENCOUNTER — Other Ambulatory Visit: Payer: Self-pay | Admitting: Internal Medicine

## 2023-12-07 ENCOUNTER — Ambulatory Visit (INDEPENDENT_AMBULATORY_CARE_PROVIDER_SITE_OTHER): Admitting: Internal Medicine

## 2023-12-07 ENCOUNTER — Encounter: Payer: Self-pay | Admitting: Internal Medicine

## 2023-12-07 VITALS — BP 136/70 | HR 75 | Temp 97.9°F | Resp 16 | Ht 70.0 in | Wt 223.2 lb

## 2023-12-07 DIAGNOSIS — Z7984 Long term (current) use of oral hypoglycemic drugs: Secondary | ICD-10-CM

## 2023-12-07 DIAGNOSIS — I10 Essential (primary) hypertension: Secondary | ICD-10-CM | POA: Diagnosis not present

## 2023-12-07 DIAGNOSIS — E119 Type 2 diabetes mellitus without complications: Secondary | ICD-10-CM | POA: Diagnosis not present

## 2023-12-07 DIAGNOSIS — E785 Hyperlipidemia, unspecified: Secondary | ICD-10-CM | POA: Diagnosis not present

## 2023-12-07 DIAGNOSIS — Z09 Encounter for follow-up examination after completed treatment for conditions other than malignant neoplasm: Secondary | ICD-10-CM

## 2023-12-07 DIAGNOSIS — Z23 Encounter for immunization: Secondary | ICD-10-CM

## 2023-12-07 LAB — BASIC METABOLIC PANEL WITH GFR
BUN: 23 mg/dL (ref 6–23)
CO2: 31 meq/L (ref 19–32)
Calcium: 9.1 mg/dL (ref 8.4–10.5)
Chloride: 103 meq/L (ref 96–112)
Creatinine, Ser: 0.72 mg/dL (ref 0.40–1.50)
GFR: 101.41 mL/min (ref >60.00)
Glucose, Bld: 155 mg/dL — ABNORMAL HIGH (ref 70–99)
Potassium: 4.8 meq/L (ref 3.5–5.1)
Sodium: 138 meq/L (ref 135–145)

## 2023-12-07 LAB — HEMOGLOBIN A1C: Hgb A1c MFr Bld: 6.6 % — ABNORMAL HIGH (ref 4.6–6.5)

## 2023-12-07 NOTE — Progress Notes (Signed)
   Subjective:    Patient ID: Shaun Vargas, male    DOB: 1966-03-01, 58 y.o.   MRN: [id number]  DOS:  12/07/2023 Type of visit - description: Follow-up  Chronic medical problems addressed. Feeling well and has no concerns  Review of Systems See above   Past Medical History:  Diagnosis Date   Anxiety    Diabetes mellitus 05/22/07    Past Surgical History:  Procedure Laterality Date   NO PAST SURGERIES      Current Outpatient Medications  Medication Instructions   atorvastatin  (LIPITOR) 10 mg, Oral, Daily   glimepiride  (AMARYL ) 8 mg, Oral, Daily with breakfast   losartan  (COZAAR ) 25 mg, Oral, Daily   metFORMIN  (GLUCOPHAGE ) 1,000 mg, Oral, 2 times daily with meals   pioglitazone  (ACTOS ) 30 mg, Oral, Daily       Objective:   Physical Exam BP 136/70   Pulse 75   Temp 97.9 F (36.6 C) (Oral)   Resp 16   Ht 5' 10 (1.778 m)   Wt 223 lb 4 oz (101.3 kg)   SpO2 98%   BMI 32.03 kg/m  General:   Well developed, NAD, BMI noted. HEENT:  Normocephalic . Face symmetric, atraumatic Lungs:  CTA B Normal respiratory effort, no intercostal retractions, no accessory muscle use. Heart: RRR,  no murmur.  Lower extremities: no pretibial edema bilaterally  Skin: Not pale. Not jaundice Neurologic:  alert & oriented X3.  Speech normal, gait appropriate for age and unassisted Psych--  Cognition and judgment appear intact.  Cooperative with normal attention span and concentration.  Behavior appropriate. No anxious or depressed appearing.      Assessment    Assessment   DM 2008  HTN Hyperlipidemia Anxiety  Allergic to bumble bees sting, epi-pen rx 12-2016.  Strongly declined a RF 06/02/2022 because had other stings without a reaction.  Benefits of EpiPen  discussed   PLAN: DM: Last A1c 6.8.  On glimepiride , metformin , pioglitazone .  Ambulatory CBGs are not check  frequently but usually around 130. Trying to decrease carbohydrate intake.  Plan: Check BMP and A1c HTN: No  ambulatory BPs, BP today is very good, continue losartan . High cholesterol: Last LDL 73.  Continue Lipitor Preventive care: - PNM 20 today. - Plans to get a COVID booster and flu shot this year. - Plans to return Cologuard. RTC 6 months

## 2023-12-07 NOTE — Patient Instructions (Signed)
 Vaccines are recommended Flu shot and COVID booster this fall   Diabetes: You can check your sugars at different times  - early in AM fasting  ( blood sugar goal 70-130) - 2 hours after a meal (blood sugar goal less than 180)      GO TO THE LAB :  Get the blood work   Your results will be posted on MyChart with my comments  Next office visit for a checkup in 6 months Please make an appointment before you leave today

## 2023-12-07 NOTE — Assessment & Plan Note (Signed)
 DM: Last A1c 6.8.  On glimepiride , metformin , pioglitazone .  Ambulatory CBGs are not check  frequently but usually around 130. Trying to decrease carbohydrate intake.  Plan: Check BMP and A1c HTN: No ambulatory BPs, BP today is very good, continue losartan . High cholesterol: Last LDL 73.  Continue Lipitor Preventive care: - PNM 20 today. - Plans to get a COVID booster and flu shot this year. - Plans to return Cologuard. RTC 6 months

## 2023-12-08 ENCOUNTER — Ambulatory Visit: Payer: Self-pay | Admitting: Internal Medicine

## 2024-02-29 ENCOUNTER — Other Ambulatory Visit: Payer: Self-pay | Admitting: Internal Medicine

## 2024-06-06 ENCOUNTER — Ambulatory Visit: Admitting: Internal Medicine

## 2024-06-06 ENCOUNTER — Encounter: Payer: Self-pay | Admitting: Internal Medicine

## 2024-06-06 VITALS — BP 132/82 | HR 86 | Temp 97.9°F | Resp 16 | Ht 70.0 in | Wt 226.4 lb

## 2024-06-06 DIAGNOSIS — Z23 Encounter for immunization: Secondary | ICD-10-CM | POA: Diagnosis not present

## 2024-06-06 DIAGNOSIS — E119 Type 2 diabetes mellitus without complications: Secondary | ICD-10-CM | POA: Diagnosis not present

## 2024-06-06 DIAGNOSIS — I10 Essential (primary) hypertension: Secondary | ICD-10-CM | POA: Diagnosis not present

## 2024-06-06 DIAGNOSIS — E785 Hyperlipidemia, unspecified: Secondary | ICD-10-CM

## 2024-06-06 DIAGNOSIS — Z7984 Long term (current) use of oral hypoglycemic drugs: Secondary | ICD-10-CM

## 2024-06-06 LAB — MICROALBUMIN / CREATININE URINE RATIO
Creatinine,U: 106.6 mg/dL
Microalb Creat Ratio: UNDETERMINED mg/g (ref 0.0–30.0)
Microalb, Ur: <0.7 mg/dL

## 2024-06-06 LAB — LIPID PANEL
Cholesterol: 153 mg/dL (ref 28–200)
HDL: 79.8 mg/dL
LDL Cholesterol: 65 mg/dL (ref 10–99)
NonHDL: 73.51
Total CHOL/HDL Ratio: 2
Triglycerides: 42 mg/dL (ref 10.0–149.0)
VLDL: 8.4 mg/dL (ref 0.0–40.0)

## 2024-06-06 LAB — BASIC METABOLIC PANEL WITH GFR
BUN: 22 mg/dL (ref 6–23)
CO2: 28 meq/L (ref 19–32)
Calcium: 9.3 mg/dL (ref 8.4–10.5)
Chloride: 100 meq/L (ref 96–112)
Creatinine, Ser: 0.76 mg/dL (ref 0.40–1.50)
GFR: 99.42 mL/min
Glucose, Bld: 109 mg/dL — ABNORMAL HIGH (ref 70–99)
Potassium: 4.6 meq/L (ref 3.5–5.1)
Sodium: 136 meq/L (ref 135–145)

## 2024-06-06 LAB — HEMOGLOBIN A1C: Hgb A1c MFr Bld: 6.5 % (ref 4.6–6.5)

## 2024-06-06 NOTE — Patient Instructions (Signed)
 Please read your instructions carefully.  VAYA AL LABOREATORIO PARA LAS PRUEBAS DE SANGRE  Saque una cita para su examen general en 4 meses  Chequear el azucar mas seguido: Debe estar entre 70 y 130  Si tiene sintomas de chief financial officer baja dejemelo saber   Popgase la vacuna de covid en la farmacia    GO TO THE LAB :  Get the blood work    Go to the front desk for the checkout Please make an appointment for a physical exam in 4 months   Diabetes  You can check your sugars at different times  - early in AM fasting  ( blood sugar goal 70-130) - 2 hours after a meal (blood sugar goal less than 180)

## 2024-06-06 NOTE — Progress Notes (Unsigned)
" ° °  Subjective:    Patient ID: Shaun Vargas, male    DOB: 23-Jan-1966, 59 y.o.   MRN: [id number]  DOS:  06/06/2024 Follow-up Chronic medical problems addressed. In general feeling well. Had a single episode of symptoms consistent with low blood sugar.  He had some alcohol at night and the next morning he fell shaky, CBG was in the 60s. Denies any lower extremity paresthesias   Review of Systems See above   Past Medical History:  Diagnosis Date   Anxiety    Diabetes mellitus 05/22/07    Past Surgical History:  Procedure Laterality Date   NO PAST SURGERIES      Current Outpatient Medications  Medication Instructions   atorvastatin  (LIPITOR) 10 MG tablet TOME 1 TABLETA POR VIA ORAL TODOS LOS DIAS   glimepiride  (AMARYL ) 8 mg, Oral, Daily with breakfast   losartan  (COZAAR ) 25 mg, Oral, Daily   metFORMIN  (GLUCOPHAGE ) 1000 MG tablet TOME UNA TABLETA (1,000 MG TOTAL) POR VIA ORAL DOS VECES AL DIA CON ALIMENTO   pioglitazone  (ACTOS ) 30 MG tablet TOME 1 TABLETA POR VIA ORAL TODOS LOS DIAS       Objective:   Physical Exam BP 132/82   Pulse 86   Temp 97.9 F (36.6 C) (Oral)   Resp 16   Ht 5' 10 (1.778 m)   Wt 226 lb 6 oz (102.7 kg)   SpO2 98%   BMI 32.48 kg/m  General:   Well developed, NAD, BMI noted. HEENT:  Normocephalic . Face symmetric, atraumatic Lungs:  CTA B Normal respiratory effort, no intercostal retractions, no accessory muscle use. Heart: RRR,  no murmur.  DM foot exam: No edema, good pedal pulses, pinprick examination normal Skin: Not pale. Not jaundice Neurologic:  alert & oriented X3.  Speech normal, gait appropriate for age and unassisted Psych--  Cognition and judgment appear intact.  Cooperative with normal attention span and concentration.  Behavior appropriate. No anxious or depressed appearing.      Assessment   Assessment   DM 2008  HTN Hyperlipidemia Anxiety  Allergic to bumble bees sting, epi-pen rx 12-2016.  Strongly declined a RF  06/02/2022 and again 06/06/2024 because had other stings without a reaction.  Benefits of EpiPen  discussed ASA- declines to take as off 05/2024, benefits d/w pt   PLAN: DM: On glimepiride  metformin  and pioglitazone .  Checks  CBGs once a week.  Had a single episode of low blood sugar symptoms. I reminded the patient symptoms of low blood sugar, if that ever happened recommend to check CBGs immediately and get some food. If that happens often I will consider adjust glimepiride  dose. Feet exam normal. Check A1c and micro HTN: On losartan , BP okay today, check BMP High cholesterol: On Lipitor, last LFTs normal, check FLP. Aspirin: recommended in the context of DM, declines. EpiPen : Declined a prescription. Preventive care: Tdap and flu shot today, plans to get a COVID-vaccine at the pharmacy. RTC CPX 4 months   "

## 2024-06-07 NOTE — Assessment & Plan Note (Signed)
 DM: On glimepiride  metformin  and pioglitazone .  Checks  CBGs once a week.  Had a single episode of low blood sugar symptoms. I reminded the patient symptoms of low blood sugar, if that ever happened recommend to check CBGs immediately and get some food. If that happens often I will consider adjust glimepiride  dose. Feet exam normal. Check A1c and micro HTN: On losartan , BP okay today, check BMP High cholesterol: On Lipitor, last LFTs normal, check FLP. Aspirin: recommended in the context of DM, declines. EpiPen : Declined a prescription. Preventive care: Tdap and flu shot today, plans to get a COVID-vaccine at the pharmacy. RTC CPX 4 months

## 2024-06-08 ENCOUNTER — Ambulatory Visit: Payer: Self-pay | Admitting: Internal Medicine

## 2024-10-07 ENCOUNTER — Encounter: Admitting: Internal Medicine
